# Patient Record
Sex: Female | Born: 1980 | Hispanic: No | Marital: Single | Smoking: Never smoker
Health system: Southern US, Community
[De-identification: ages and names within clinical notes are randomized; demographics above are authoritative.]

## PROBLEM LIST (undated history)

## (undated) DIAGNOSIS — G8929 Other chronic pain: Secondary | ICD-10-CM

## (undated) DIAGNOSIS — M549 Dorsalgia, unspecified: Secondary | ICD-10-CM

## (undated) DIAGNOSIS — J45909 Unspecified asthma, uncomplicated: Secondary | ICD-10-CM

## (undated) DIAGNOSIS — I890 Lymphedema, not elsewhere classified: Secondary | ICD-10-CM

---

## 2000-02-10 ENCOUNTER — Emergency Department (HOSPITAL_COMMUNITY): Admission: EM | Admit: 2000-02-10 | Discharge: 2000-02-10 | Payer: Self-pay

## 2000-11-05 ENCOUNTER — Emergency Department (HOSPITAL_COMMUNITY): Admission: EM | Admit: 2000-11-05 | Discharge: 2000-11-05 | Payer: Self-pay | Admitting: Emergency Medicine

## 2000-12-04 ENCOUNTER — Emergency Department (HOSPITAL_COMMUNITY): Admission: EM | Admit: 2000-12-04 | Discharge: 2000-12-04 | Payer: Self-pay | Admitting: Emergency Medicine

## 2005-12-31 ENCOUNTER — Emergency Department (HOSPITAL_COMMUNITY): Admission: EM | Admit: 2005-12-31 | Discharge: 2005-12-31 | Payer: Self-pay | Admitting: Emergency Medicine

## 2017-07-09 ENCOUNTER — Emergency Department (HOSPITAL_COMMUNITY): Payer: Medicaid Other

## 2017-07-09 ENCOUNTER — Inpatient Hospital Stay (HOSPITAL_COMMUNITY)
Admission: EM | Admit: 2017-07-09 | Discharge: 2017-07-10 | DRG: 872 | Discharge status: Against Medical Advice | Payer: Medicaid Other | Attending: Internal Medicine | Admitting: Internal Medicine

## 2017-07-09 DIAGNOSIS — M549 Dorsalgia, unspecified: Secondary | ICD-10-CM | POA: Diagnosis present

## 2017-07-09 DIAGNOSIS — E872 Acidosis, unspecified: Secondary | ICD-10-CM

## 2017-07-09 DIAGNOSIS — L0390 Cellulitis, unspecified: Secondary | ICD-10-CM

## 2017-07-09 DIAGNOSIS — E871 Hypo-osmolality and hyponatremia: Secondary | ICD-10-CM

## 2017-07-09 DIAGNOSIS — L03115 Cellulitis of right lower limb: Secondary | ICD-10-CM | POA: Diagnosis present

## 2017-07-09 DIAGNOSIS — A419 Sepsis, unspecified organism: Secondary | ICD-10-CM | POA: Diagnosis present

## 2017-07-09 DIAGNOSIS — G8929 Other chronic pain: Secondary | ICD-10-CM | POA: Diagnosis present

## 2017-07-09 DIAGNOSIS — L97419 Non-pressure chronic ulcer of right heel and midfoot with unspecified severity: Secondary | ICD-10-CM | POA: Diagnosis present

## 2017-07-09 DIAGNOSIS — Z791 Long term (current) use of non-steroidal anti-inflammatories (NSAID): Secondary | ICD-10-CM

## 2017-07-09 DIAGNOSIS — M793 Panniculitis, unspecified: Secondary | ICD-10-CM | POA: Diagnosis present

## 2017-07-09 DIAGNOSIS — Z5321 Procedure and treatment not carried out due to patient leaving prior to being seen by health care provider: Secondary | ICD-10-CM | POA: Diagnosis not present

## 2017-07-09 DIAGNOSIS — I890 Lymphedema, not elsewhere classified: Secondary | ICD-10-CM | POA: Diagnosis present

## 2017-07-09 HISTORY — DX: Dorsalgia, unspecified: M54.9

## 2017-07-09 HISTORY — DX: Lymphedema, not elsewhere classified: I89.0

## 2017-07-09 HISTORY — DX: Other chronic pain: G89.29

## 2017-07-09 HISTORY — DX: Morbid (severe) obesity due to excess calories: E66.01

## 2017-07-09 LAB — I-STAT CG4 LACTIC ACID, ED: LACTIC ACID, VENOUS: 2.84 mmol/L — AB (ref 0.5–1.9)

## 2017-07-09 LAB — COMPREHENSIVE METABOLIC PANEL
ALK PHOS: 51 U/L (ref 38–126)
ALT: 15 U/L (ref 14–54)
AST: 26 U/L (ref 15–41)
Albumin: 2.6 g/dL — ABNORMAL LOW (ref 3.5–5.0)
Anion gap: 7 (ref 5–15)
BUN: 9 mg/dL (ref 6–20)
CALCIUM: 8.2 mg/dL — AB (ref 8.9–10.3)
CO2: 28 mmol/L (ref 22–32)
CREATININE: 0.75 mg/dL (ref 0.44–1.00)
Chloride: 94 mmol/L — ABNORMAL LOW (ref 101–111)
Glucose, Bld: 149 mg/dL — ABNORMAL HIGH (ref 65–99)
Potassium: 2.8 mmol/L — ABNORMAL LOW (ref 3.5–5.1)
Sodium: 129 mmol/L — ABNORMAL LOW (ref 135–145)
Total Bilirubin: 0.7 mg/dL (ref 0.3–1.2)
Total Protein: 7.6 g/dL (ref 6.5–8.1)

## 2017-07-09 LAB — C-REACTIVE PROTEIN: CRP: 19.6 mg/dL — AB (ref <1.0)

## 2017-07-09 LAB — CBC WITH DIFFERENTIAL/PLATELET
BASOS PCT: 0 %
Basophils Absolute: 0 10*3/uL (ref 0.0–0.1)
EOS PCT: 1 %
Eosinophils Absolute: 0.2 10*3/uL (ref 0.0–0.7)
HCT: 29.6 % — ABNORMAL LOW (ref 36.0–46.0)
HEMOGLOBIN: 9.1 g/dL — AB (ref 12.0–15.0)
Lymphocytes Relative: 9 %
Lymphs Abs: 1.6 10*3/uL (ref 0.7–4.0)
MCH: 21.5 pg — AB (ref 26.0–34.0)
MCHC: 30.7 g/dL (ref 30.0–36.0)
MCV: 69.8 fL — AB (ref 78.0–100.0)
MONO ABS: 1.4 10*3/uL — AB (ref 0.1–1.0)
Monocytes Relative: 8 %
NEUTROS ABS: 14.3 10*3/uL — AB (ref 1.7–7.7)
NEUTROS PCT: 82 %
PLATELETS: 394 10*3/uL (ref 150–400)
RBC: 4.24 MIL/uL (ref 3.87–5.11)
RDW: 18.4 % — ABNORMAL HIGH (ref 11.5–15.5)
WBC: 17.5 10*3/uL — ABNORMAL HIGH (ref 4.0–10.5)

## 2017-07-09 LAB — BRAIN NATRIURETIC PEPTIDE: B Natriuretic Peptide: 26.4 pg/mL (ref 0.0–100.0)

## 2017-07-09 LAB — SEDIMENTATION RATE: SED RATE: 109 mm/h — AB (ref 0–22)

## 2017-07-09 MED ORDER — SODIUM CHLORIDE 0.9 % IV BOLUS (SEPSIS): 1000.0000 mL | Freq: Once | INTRAVENOUS | Status: DC

## 2017-07-09 MED ORDER — LACTATED RINGERS IV BOLUS (SEPSIS)
1000.0000 mL | Freq: Once | INTRAVENOUS | Status: AC
Start: 2017-07-09 — End: 2017-07-09
  Administered 2017-07-09: 1000 mL via INTRAVENOUS

## 2017-07-09 MED ORDER — PIPERACILLIN-TAZOBACTAM 3.375 G IVPB 30 MIN
3.3750 g | Freq: Once | INTRAVENOUS | Status: AC
  Administered 2017-07-09: 3.375 g via INTRAVENOUS
  Filled 2017-07-09: qty 50

## 2017-07-09 MED ORDER — POTASSIUM CHLORIDE 10 MEQ/100ML IV SOLN
10.0000 meq | INTRAVENOUS | Status: AC
  Administered 2017-07-09 (×2): 10 meq via INTRAVENOUS
  Filled 2017-07-09 (×2): qty 100

## 2017-07-09 MED ORDER — IOPAMIDOL (ISOVUE-300) INJECTION 61%
100.0000 mL | Freq: Once | INTRAVENOUS | Status: AC | PRN
Start: 2017-07-09 — End: 2017-07-09
  Administered 2017-07-09: 100 mL via INTRAVENOUS

## 2017-07-09 MED ORDER — SODIUM CHLORIDE 0.9 % IV BOLUS (SEPSIS)
500.0000 mL | Freq: Once | INTRAVENOUS | Status: AC
  Administered 2017-07-09: 500 mL via INTRAVENOUS

## 2017-07-09 MED ORDER — LACTATED RINGERS IV SOLN
INTRAVENOUS | Status: DC
  Administered 2017-07-10: 03:00:00 via INTRAVENOUS

## 2017-07-09 MED ORDER — VANCOMYCIN HCL IN DEXTROSE 1-5 GM/200ML-% IV SOLN
1000.0000 mg | Freq: Once | INTRAVENOUS | Status: AC
  Administered 2017-07-09: 1000 mg via INTRAVENOUS
  Filled 2017-07-09: qty 200

## 2017-07-09 MED ORDER — IOPAMIDOL (ISOVUE-300) INJECTION 61%
INTRAVENOUS | Status: AC
  Filled 2017-07-09: qty 100

## 2017-07-09 MED ORDER — POTASSIUM CHLORIDE CRYS ER 20 MEQ PO TBCR
40.0000 meq | EXTENDED_RELEASE_TABLET | Freq: Once | ORAL | Status: AC
  Administered 2017-07-09: 40 meq via ORAL
  Filled 2017-07-09: qty 2

## 2017-07-09 NOTE — ED Provider Notes (Signed)
[facility]-EMERGENCY DEPT Provider Note   CSN: [id number] Arrival date & time: 07/09/17  1733     History   Chief Complaint Chief Complaint  Patient presents with  . Infected foot (right)    HPI Ellen Schroeder is a 36 y.o. female.  HPI   36 year old female with no reported history here with worsening leg pain and swelling.  The patient is a difficult historian.  She reports that she has been dealing with leg swelling for several years.  She is on Lasix for this.  She has had increasing right leg pain and swelling over the last several days.  She is also noticed worsening abdominal pain along the inferior fold of her pannus.  She has had associated fevers and chills.  She is had poor appetite.  She denies any shortness of breath.  No pleuritic chest pain.  She denies any preceding trauma.  Pain is worse with any kind of movement or attempt to bear weight.  No alleviating factors.   Past Medical History:  Diagnosis Date  . Chronic back pain   . Lymph edema   . Morbid obesity [facility](HCC)     Patient Active Problem List   Diagnosis Date Noted  . Sepsis (HCC) 07/10/2017  . Cellulitis of right leg 07/10/2017  . 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 No pertinent surgical history.  OB History    No data available       Home Medications    Prior to Admission medications   Medication Sig Start Date End Date Taking? Authorizing Provider  furosemide (LASIX) 20 MG tablet Take 20 mg by mouth 2 (two) times daily. 06/18/17  Yes [provider]  ibuprofen (ADVIL,MOTRIN) 800 MG tablet Take 800 mg by mouth 3 (three) times daily. 06/18/17  Yes [provider]  potassium chloride 20 MEQ/15ML (10%) SOLN Take 15 mLs by mouth daily. 06/19/17  Yes [provider]  traMADol (ULTRAM) 50 MG tablet Take 1-2 tablets by mouth every 8 (eight) hours as needed for moderate pain or severe pain. 06/18/17  Yes [provider]    Family  History Family History  Problem Relation Age of Onset  . Diabetes Mother   . Hypertension Mother   . Diabetes Father   . Hypertension Father   . Hyperlipidemia Father     Social History Social History   Tobacco Use  . Smoking status: Never Smoker  Substance Use Topics  . Alcohol use: No    Frequency: Never  . Drug use: No     Allergies   Patient has no known allergies.   Review of Systems Review of Systems  Constitutional: Positive for fatigue.  Cardiovascular: Positive for leg swelling.  Skin: Positive for rash and wound.  Neurological: Positive for weakness.     Physical Exam Updated Vital Signs BP 110/61   Pulse 99   Temp 99.2 F (37.3 C) (Oral)   Resp (!) 29   LMP 06/05/2017   SpO2 96%   Physical Exam  Constitutional: She is oriented to person, place, and time. She appears well-developed and well-nourished. No distress.  HENT:  Head: Normocephalic and atraumatic.  Eyes: Conjunctivae are normal.  Neck: Neck supple.  Cardiovascular: Normal rate, regular rhythm and normal heart sounds. Exam reveals no friction rub.  No murmur heard. Pulmonary/Chest: Effort normal and breath sounds normal. No respiratory distress. She has no wheezes. She has no rales.  Abdominal: She exhibits no distension.  Significant erythema of inferior  pannicular fold with associated superficial skin ulcerations and excoriations  Genitourinary:  Genitourinary Comments: Erythema, induration to mons and inguinal folds and pannicular folds. There does not appear to be overt erythema, drainage or extension to the labia majora or minora. There is no perineal erythema or discharge. No crepitance.  Musculoskeletal: She exhibits no edema.  [place] bilateral lower extremity edema with significant erythema of right lower leg, with open, macerated skin ulcer along plantar aspect of foot  Neurological: She is alert and oriented to person, place, and time. She exhibits normal muscle tone.  Skin:  Skin is warm. Capillary refill takes less than 2 seconds.  Psychiatric: She has a normal mood and affect.  Nursing note and vitals reviewed.             ED Treatments / Results  Labs (all labs ordered are listed, but only abnormal results are displayed) Labs Reviewed  CBC WITH DIFFERENTIAL/PLATELET - Abnormal; Notable for the following components:      Result Value   WBC 17.5 (*)    Hemoglobin 9.1 (*)    HCT 29.6 (*)    MCV 69.8 (*)    MCH 21.5 (*)    RDW 18.4 (*)    Neutro Abs 14.3 (*)    Monocytes Absolute 1.4 (*)    All other components within normal limits  COMPREHENSIVE METABOLIC PANEL - Abnormal; Notable for the following components:   Sodium 129 (*)    Potassium 2.8 (*)    Chloride 94 (*)    Glucose, Bld 149 (*)    Calcium 8.2 (*)    Albumin 2.6 (*)    All other components within normal limits  SEDIMENTATION RATE - Abnormal; Notable for the following components:   Sed Rate 109 (*)    All other components within normal limits  C-REACTIVE PROTEIN - Abnormal; Notable for the following components:   CRP 19.6 (*)    All other components within normal limits  I-STAT CG4 LACTIC ACID, ED - Abnormal; Notable for the following components:   Lactic Acid, Venous 2.84 (*)    All other components within normal limits  CULTURE, BLOOD (ROUTINE X 2)  CULTURE, BLOOD (ROUTINE X 2)  BRAIN NATRIURETIC PEPTIDE  HEMOGLOBIN A1C  HIV ANTIBODY (ROUTINE TESTING)  PREALBUMIN  CBC  BASIC METABOLIC PANEL  BASIC METABOLIC PANEL  LACTIC ACID, PLASMA  I-STAT CG4 LACTIC ACID, ED    EKG  EKG Interpretation None       Radiology Dg Tibia/fibula Right  Result Date: 07/09/2017 CLINICAL DATA:  Leg wounds EXAM: RIGHT TIBIA AND FIBULA - 2 VIEW COMPARISON:  None. FINDINGS: No fracture or malalignment. No periostitis or bone destruction. Diffuse soft tissue swelling. No soft tissue gas IMPRESSION: No acute osseous abnormality Electronically Signed   By: Jasmine Pang M.D.   On:  07/09/2017 20:57   Ct Abdomen Pelvis W Contrast  Result Date: 07/09/2017 CLINICAL DATA:  36 year old female with nausea vomiting. EXAM: CT ABDOMEN AND PELVIS WITH CONTRAST TECHNIQUE: Multidetector CT imaging of the abdomen and pelvis was performed using the standard protocol following bolus administration of intravenous contrast. CONTRAST:  ISOVUE-300 IOPAMIDOL (ISOVUE-300) INJECTION 61% COMPARISON:  None. FINDINGS: Lower chest: Minimal bibasilar atelectatic changes. There is herniation of small amount of mesenteric fat through the diaphragmatic hiatus. No intra-abdominal free air or free fluid. Hepatobiliary: There is diffuse fatty infiltration of the liver. No intrahepatic biliary ductal dilatation. The gallbladder is mildly distended. Tiny stones or sludge may be present in the  gallbladder fundus. No pericholecystic fluid or evidence of acute cholecystitis by CT. Pancreas: Unremarkable. No pancreatic ductal dilatation or surrounding inflammatory changes. Spleen: Normal in size without focal abnormality. Adrenals/Urinary Tract: The adrenal glands are unremarkable. There is no hydronephrosis on either side. The visualized ureters and urinary bladder appear unremarkable. Stomach/Bowel: There is no bowel obstruction or active inflammation. Normal appendix. Vascular/Lymphatic: No significant vascular findings are present. No enlarged abdominal or pelvic lymph nodes. Reproductive: The uterus is anteverted and grossly unremarkable. The ovaries appear unremarkable as well. No pelvic mass. Other: There is thickening of the skin and stranding of the subcutaneous soft tissues of the anterior pelvic wall. No fluid collection. Musculoskeletal: No acute or significant osseous findings. IMPRESSION: 1. No bowel obstruction or active inflammation.  Normal appendix. 2. Fatty liver. Electronically Signed   By: Elgie Collard M.D.   On: 07/09/2017 22:08   Dg Foot Complete Right  Result Date: 07/09/2017 CLINICAL  DATA:  Right leg wounds EXAM: RIGHT FOOT COMPLETE - 3+ VIEW COMPARISON:  12/31/2005 FINDINGS: No fracture or malalignment. No soft tissue gas. No periostitis or bone destruction. IMPRESSION: No acute osseous abnormality. Electronically Signed   By: Jasmine Pang M.D.   On: 07/09/2017 20:56   Dg Femur Min 2 Views Right  Result Date: 07/09/2017 CLINICAL DATA:  Leg pain EXAM: RIGHT FEMUR 2 VIEWS COMPARISON:  None. FINDINGS: The study is limited by habitus. No fracture or malalignment. Mild degenerative changes at the knee. Soft tissue swelling. No soft tissue gas. No periostitis or bone destruction IMPRESSION: No definite acute osseous abnormality. Electronically Signed   By: Jasmine Pang M.D.   On: 07/09/2017 20:58    Procedures .Critical Care Performed by: Shaune Pollack, MD Authorized by: Shaune Pollack, MD   Critical care provider statement:    Critical care time (minutes):  35   Critical care time was exclusive of:  Separately billable procedures and treating other patients and teaching time   Critical care was necessary to treat or prevent imminent or life-threatening deterioration of the following conditions:  Circulatory failure, dehydration and sepsis   Critical care was time spent personally by me on the following activities:  Development of treatment plan with patient or surrogate, discussions with consultants, evaluation of patient's response to treatment, examination of patient, obtaining history from patient or surrogate, ordering and performing treatments and interventions, ordering and review of laboratory studies, ordering and review of radiographic studies, pulse oximetry, re-evaluation of patient's condition and review of old charts   I assumed direction of critical care for this patient from another provider in my specialty: no     (including critical care time)  Medications Ordered in ED Medications  potassium chloride 10 mEq in 100 mL IVPB (10 mEq Intravenous Not Given  07/10/17 0224)  iopamidol (ISOVUE-300) 61 % injection (not administered)  lactated ringers infusion ( Intravenous New Bag/Given 07/10/17 0231)  acetaminophen (TYLENOL) tablet 650 mg (not administered)    Or  acetaminophen (TYLENOL) suppository 650 mg (not administered)  ondansetron (ZOFRAN) tablet 4 mg (not administered)    Or  ondansetron (ZOFRAN) injection 4 mg (not administered)  enoxaparin (LOVENOX) injection 40 mg (not administered)  piperacillin-tazobactam (ZOSYN) IVPB 3.375 g (not administered)  vancomycin (VANCOCIN) IVPB 1000 mg/200 mL premix (0 mg Intravenous Stopped 07/09/17 2216)  piperacillin-tazobactam (ZOSYN) IVPB 3.375 g (0 g Intravenous Stopped 07/09/17 2039)  potassium chloride SA (K-DUR,KLOR-CON) CR tablet 40 mEq (40 mEq Oral Given 07/09/17 2149)  sodium chloride 0.9 % bolus 500 mL (  0 mLs Intravenous Stopped 07/09/17 2216)  iopamidol (ISOVUE-300) 61 % injection 100 mL (100 mLs Intravenous Contrast Given 07/09/17 2122)  sodium chloride 0.9 % bolus 500 mL (0 mLs Intravenous Stopped 07/09/17 2304)  lactated ringers bolus 1,000 mL (0 mLs Intravenous Stopped 07/09/17 2343)  lactated ringers bolus 1,000 mL (0 mLs Intravenous Stopped 07/09/17 2343)  sodium chloride 0.9 % bolus 1,000 mL (0 mLs Intravenous Stopped 07/10/17 0130)  clindamycin (CLEOCIN) IVPB 900 mg (900 mg Intravenous New Bag/Given 07/10/17 0231)     Initial Impression / Assessment and Plan / ED Course  I have reviewed the triage vital signs and the nursing notes.  Pertinent labs & imaging results that were available during my care of the patient were reviewed by me and considered in my medical decision making (see chart for details).     36 year old female with history of morbid obesity, lymphedema, who presents with erythema and pain of her right leg.  Patient initially hemodynamically stable.  She has a significant leukocytosis as well as lactic acidosis.  She was given IV fluids and antibiotics.  Code sepsis  initiated.  This was initially delayed as patient was not tachycardic or hypotensive.  She has been given fluid resuscitation but given her morbid obesity, will hold on a full 30 cc/kg as she has also previously been on Lasix due to her chronic lymphedema and leg swelling.  Regarding her source of infection, I suspect she has panniculitis as well as a focal cellulitis and possible ulceration of the right heel.  She has no pain beyond the areas of erythema, no crepitance, and I obtained a CT scan which shows no evidence of necrotizing infection, though will need to be monitored closely. D/w Dr. Julian ReilGardner of Hospitalist who is in agreement. Repeat exam shows no expansion of erythema or tenderness. There does not appear to be extension onto the labia or perineum. Admit to SDU.  Final Clinical Impressions(s) / ED Diagnoses   Final diagnoses:  Sepsis due to cellulitis (HCC)  Lactic acidosis  Hyponatremia    ED Discharge Orders    None       Shaune PollackIsaacs, Spence Soberano, MD 07/10/17 [phone]

## 2017-07-09 NOTE — ED Triage Notes (Signed)
Per EMS, pt is coming from home with complaints of pain in her right foot with pitting edema x3 days. EMS reports an open heel on patients right heel with foul odor. Pt has a hx of asthma.

## 2017-07-09 NOTE — ED Notes (Addendum)
Pt stated that she had the wound on her R leg appear a month ago and had the abdominal wound appear about a week ago. Her abdomen is hot to touch, red and edematous.  Her right leg is dusky and hot to touch, cap refill is slowed. Her left leg cap refill is normal. Her abdomen wound is weeping and so is her right leg.

## 2017-07-09 NOTE — Progress Notes (Signed)
A consult was received from an ED physician for Vancomycin and Zosyn per pharmacy dosing.  The patient's profile has been reviewed for ht/wt/allergies/indication/available labs. A one time order has been placed for the above antibiotics.  Further antibiotics/pharmacy consults should be ordered by admitting physician if indicated.                       Thank you, Bernadene Personrew Nickolas Chalfin, PharmD, BCPS Pager: [phone] 07/09/2017, 7:20 PM

## 2017-07-10 ENCOUNTER — Inpatient Hospital Stay (HOSPITAL_COMMUNITY): Admit: 2017-07-10 | Payer: Medicaid Other

## 2017-07-10 ENCOUNTER — Encounter (HOSPITAL_COMMUNITY): Payer: Self-pay | Admitting: Internal Medicine

## 2017-07-10 DIAGNOSIS — I890 Lymphedema, not elsewhere classified: Secondary | ICD-10-CM | POA: Diagnosis present

## 2017-07-10 DIAGNOSIS — Z791 Long term (current) use of non-steroidal anti-inflammatories (NSAID): Secondary | ICD-10-CM | POA: Diagnosis not present

## 2017-07-10 DIAGNOSIS — M793 Panniculitis, unspecified: Secondary | ICD-10-CM | POA: Diagnosis present

## 2017-07-10 DIAGNOSIS — L03115 Cellulitis of right lower limb: Secondary | ICD-10-CM | POA: Diagnosis present

## 2017-07-10 DIAGNOSIS — Z5321 Procedure and treatment not carried out due to patient leaving prior to being seen by health care provider: Secondary | ICD-10-CM | POA: Diagnosis not present

## 2017-07-10 DIAGNOSIS — G8929 Other chronic pain: Secondary | ICD-10-CM | POA: Diagnosis present

## 2017-07-10 DIAGNOSIS — M549 Dorsalgia, unspecified: Secondary | ICD-10-CM | POA: Diagnosis present

## 2017-07-10 DIAGNOSIS — A419 Sepsis, unspecified organism: Secondary | ICD-10-CM | POA: Diagnosis not present

## 2017-07-10 DIAGNOSIS — L97419 Non-pressure chronic ulcer of right heel and midfoot with unspecified severity: Secondary | ICD-10-CM | POA: Diagnosis present

## 2017-07-10 DIAGNOSIS — E872 Acidosis: Secondary | ICD-10-CM | POA: Diagnosis present

## 2017-07-10 DIAGNOSIS — E871 Hypo-osmolality and hyponatremia: Secondary | ICD-10-CM | POA: Diagnosis present

## 2017-07-10 LAB — BASIC METABOLIC PANEL
ANION GAP: 6 (ref 5–15)
Anion gap: 8 (ref 5–15)
BUN: 6 mg/dL (ref 6–20)
BUN: <5 mg/dL — ABNORMAL LOW (ref 6–20)
CALCIUM: 7.7 mg/dL — AB (ref 8.9–10.3)
CHLORIDE: 101 mmol/L (ref 101–111)
CO2: 27 mmol/L (ref 22–32)
CO2: 28 mmol/L (ref 22–32)
CREATININE: 0.65 mg/dL (ref 0.44–1.00)
Calcium: 7.9 mg/dL — ABNORMAL LOW (ref 8.9–10.3)
Chloride: 104 mmol/L (ref 101–111)
Creatinine, Ser: 0.62 mg/dL (ref 0.44–1.00)
GFR calc Af Amer: >60 mL/min (ref >60)
GFR calc non Af Amer: >60 mL/min (ref >60)
GFR calc non Af Amer: >60 mL/min (ref >60)
GLUCOSE: 117 mg/dL — AB (ref 65–99)
Glucose, Bld: 113 mg/dL — ABNORMAL HIGH (ref 65–99)
Potassium: 2.8 mmol/L — ABNORMAL LOW (ref 3.5–5.1)
Potassium: 3.5 mmol/L (ref 3.5–5.1)
Sodium: 137 mmol/L (ref 135–145)
Sodium: 137 mmol/L (ref 135–145)

## 2017-07-10 LAB — HIV ANTIBODY (ROUTINE TESTING W REFLEX): HIV SCREEN 4TH GENERATION: NONREACTIVE

## 2017-07-10 LAB — CBC
HEMATOCRIT: 27.6 % — AB (ref 36.0–46.0)
Hemoglobin: 8.5 g/dL — ABNORMAL LOW (ref 12.0–15.0)
MCH: 21.5 pg — ABNORMAL LOW (ref 26.0–34.0)
MCHC: 30.8 g/dL (ref 30.0–36.0)
MCV: 69.9 fL — AB (ref 78.0–100.0)
Platelets: 326 10*3/uL (ref 150–400)
RBC: 3.95 MIL/uL (ref 3.87–5.11)
RDW: 18.5 % — AB (ref 11.5–15.5)
WBC: 12.5 10*3/uL — AB (ref 4.0–10.5)

## 2017-07-10 LAB — I-STAT CG4 LACTIC ACID, ED: Lactic Acid, Venous: 1.3 mmol/L (ref 0.5–1.9)

## 2017-07-10 LAB — HEMOGLOBIN A1C
Hgb A1c MFr Bld: 6.2 % — ABNORMAL HIGH (ref 4.8–5.6)
MEAN PLASMA GLUCOSE: 131.24 mg/dL

## 2017-07-10 LAB — PREALBUMIN

## 2017-07-10 MED ORDER — ONDANSETRON HCL 4 MG PO TABS: 4.0000 mg | ORAL_TABLET | Freq: Four times a day (QID) | ORAL | Status: DC | PRN

## 2017-07-10 MED ORDER — POTASSIUM CHLORIDE CRYS ER 20 MEQ PO TBCR: 40.0000 meq | EXTENDED_RELEASE_TABLET | Freq: Once | ORAL | Status: DC

## 2017-07-10 MED ORDER — ONDANSETRON HCL 4 MG/2ML IJ SOLN: 4.0000 mg | Freq: Four times a day (QID) | INTRAMUSCULAR | Status: DC | PRN

## 2017-07-10 MED ORDER — ACETAMINOPHEN 650 MG RE SUPP: 650.0000 mg | Freq: Four times a day (QID) | RECTAL | Status: DC | PRN

## 2017-07-10 MED ORDER — ENOXAPARIN SODIUM 40 MG/0.4ML ~~LOC~~ SOLN: 40.0000 mg | SUBCUTANEOUS | Status: DC

## 2017-07-10 MED ORDER — CLINDAMYCIN PHOSPHATE 900 MG/50ML IV SOLN
900.0000 mg | Freq: Once | INTRAVENOUS | Status: AC
  Administered 2017-07-10: 900 mg via INTRAVENOUS
  Filled 2017-07-10: qty 50

## 2017-07-10 MED ORDER — PIPERACILLIN-TAZOBACTAM 3.375 G IVPB
3.3750 g | Freq: Three times a day (TID) | INTRAVENOUS | Status: DC
  Administered 2017-07-10: 3.375 g via INTRAVENOUS
  Filled 2017-07-10: qty 50

## 2017-07-10 MED ORDER — SODIUM CHLORIDE 0.9 % IV BOLUS (SEPSIS)
1000.0000 mL | Freq: Once | INTRAVENOUS | Status: AC
  Administered 2017-07-10: 1000 mL via INTRAVENOUS

## 2017-07-10 MED ORDER — VANCOMYCIN HCL 10 G IV SOLR
1250.0000 mg | Freq: Three times a day (TID) | INTRAVENOUS | Status: DC
  Administered 2017-07-10: 1250 mg via INTRAVENOUS
  Filled 2017-07-10: qty 1250
  Filled 2017-07-10: qty 500

## 2017-07-10 MED ORDER — ACETAMINOPHEN 325 MG PO TABS: 650.0000 mg | ORAL_TABLET | Freq: Four times a day (QID) | ORAL | Status: DC | PRN

## 2017-07-10 NOTE — ED Notes (Signed)
I gave critical I Stat CG4 results to MD Isaacs 

## 2017-07-10 NOTE — H&P (Addendum)
History and Physical    Ellen Schroeder ZOX:[id number] DOB: 1981/06/07 DOA: 07/09/2017  PCP: Anne Hahn, PA-C  Patient coming from: Home  I have personally briefly reviewed patient's old medical records in [facility]  Chief Complaint: Infected foot, R  HPI: Ellen Schroeder is a 36 y.o. female with medical history significant of obesity, chronic lymphedema.  Patient presents to the ED with c/o worsening R leg pain, swelling.  Patient has 2 sites of cellulitic erythema.  One is the R foot which has been worsening over the last several days.  Also erythema, swelling, and drainage from lower abdomen for past 2 days.  Associated fever and chills.  No pain with abdominal drainage.  R foot cellulitis pain is primarily located in heel.  No pain beyond site of redness at either site, no pain out of proportion to infection.   ED Course: Septic with WBC 17k, Lactate 2.8, BPs running on soft side.  Got sepsis IVF bolus.  Zosyn and vanc.   Review of Systems: As per HPI otherwise 10 point review of systems negative.   Past Medical History:  Diagnosis Date  . Chronic back pain   . Lymph edema   . Morbid obesity (HCC)     History reviewed. No pertinent surgical history.   reports that  has never smoked. She does not have any smokeless tobacco history on file. She reports that she does not drink alcohol or use drugs.  No Known Allergies  Family History  Problem Relation Age of Onset  . Diabetes Mother   . Hypertension Mother   . Diabetes Father   . Hypertension Father   . Hyperlipidemia Father      Prior to Admission medications   Medication Sig Start Date End Date Taking? Authorizing Provider  furosemide (LASIX) 20 MG tablet Take 20 mg by mouth 2 (two) times daily. 06/18/17  Yes [provider]  ibuprofen (ADVIL,MOTRIN) 800 MG tablet Take 800 mg by mouth 3 (three) times daily. 06/18/17  Yes [provider]  potassium chloride 20 MEQ/15ML (10%) SOLN Take 15 mLs by  mouth daily. 06/19/17  Yes [provider]  traMADol (ULTRAM) 50 MG tablet Take 1-2 tablets by mouth every 8 (eight) hours as needed for moderate pain or severe pain. 06/18/17  Yes [provider]    Physical Exam: Vitals:   07/09/17 1800 07/09/17 1910 07/09/17 2100 07/09/17 2300  BP:  101/62 (!) 96/54 (!) 101/55  Pulse:  98 99 (!) 101  Resp:  18 18 (!) 22  Temp:  99.2 F (37.3 C)    TempSrc:  Oral    SpO2: 100% 100% 99% 99%    Constitutional: NAD, calm, comfortable Eyes: PERRL, lids and conjunctivae normal ENMT: Mucous membranes are moist. Posterior pharynx clear of any exudate or lesions.Normal dentition.  Neck: normal, supple, no masses, no thyromegaly Respiratory: clear to auscultation bilaterally, no wheezing, no crackles. Normal respiratory effort. No accessory muscle use.  Cardiovascular: Regular rate and rhythm, no murmurs / rubs / gallops. No extremity edema. 2+ pedal pulses. No carotid bruits.  Abdomen: no tenderness, no masses palpated. No hepatosplenomegaly. Bowel sounds positive.  Musculoskeletal: no clubbing / cyanosis. No joint deformity upper and lower extremities. Good ROM, no contractures. Normal muscle tone.  Skin: 2 separate sites of cellulitis.  One on RLE coming from ulcer on posterior aspect of R heel.  Also has paniliculitis with foul smelling discharge.  Neither site has pain beyond site of redness, severe pain  out of proportion to exam findings, nor sub Q air. Neurologic: CN 2-12 grossly intact. Sensation intact, DTR normal. Strength 5/5 in all 4.  Psychiatric: Normal judgment and insight. Alert and oriented x 3. Normal mood.    Labs on Admission: I have personally reviewed following labs and imaging studies  CBC: Recent Labs  Lab 07/09/17 1851  WBC 17.5*  NEUTROABS 14.3*  HGB 9.1*  HCT 29.6*  MCV 69.8*  PLT 394   Basic Metabolic Panel: Recent Labs  Lab 07/09/17 1851  NA 129*  K 2.8*  CL 94*  CO2 28  GLUCOSE 149*  BUN 9    CREATININE 0.75  CALCIUM 8.2*   GFR: CrCl cannot be calculated (Unknown ideal weight.). Liver Function Tests: Recent Labs  Lab 07/09/17 1851  AST 26  ALT 15  ALKPHOS 51  BILITOT 0.7  PROT 7.6  ALBUMIN 2.6*   No results for input(s): LIPASE, AMYLASE in the last 168 hours. No results for input(s): AMMONIA in the last 168 hours. Coagulation Profile: No results for input(s): INR, PROTIME in the last 168 hours. Cardiac Enzymes: No results for input(s): CKTOTAL, CKMB, CKMBINDEX, TROPONINI in the last 168 hours. BNP (last 3 results) No results for input(s): PROBNP in the last 8760 hours. HbA1C: No results for input(s): HGBA1C in the last 72 hours. CBG: No results for input(s): GLUCAP in the last 168 hours. Lipid Profile: No results for input(s): CHOL, HDL, LDLCALC, TRIG, CHOLHDL, LDLDIRECT in the last 72 hours. Thyroid Function Tests: No results for input(s): TSH, T4TOTAL, FREET4, T3FREE, THYROIDAB in the last 72 hours. Anemia Panel: No results for input(s): VITAMINB12, FOLATE, FERRITIN, TIBC, IRON, RETICCTPCT in the last 72 hours. Urine analysis: No results found for: COLORURINE, APPEARANCEUR, LABSPEC, PHURINE, GLUCOSEU, HGBUR, BILIRUBINUR, KETONESUR, PROTEINUR, UROBILINOGEN, NITRITE, LEUKOCYTESUR  Radiological Exams on Admission: Dg Tibia/fibula Right  Result Date: 07/09/2017 CLINICAL DATA:  Leg wounds EXAM: RIGHT TIBIA AND FIBULA - 2 VIEW COMPARISON:  None. FINDINGS: No fracture or malalignment. No periostitis or bone destruction. Diffuse soft tissue swelling. No soft tissue gas IMPRESSION: No acute osseous abnormality Electronically Signed   By: Jasmine PangKim  Fujinaga M.D.   On: 07/09/2017 20:57   Ct Abdomen Pelvis W Contrast  Result Date: 07/09/2017 CLINICAL DATA:  36 year old female with nausea vomiting. EXAM: CT ABDOMEN AND PELVIS WITH CONTRAST TECHNIQUE: Multidetector CT imaging of the abdomen and pelvis was performed using the standard protocol following bolus administration  of intravenous contrast. CONTRAST:  100mL ISOVUE-300 IOPAMIDOL (ISOVUE-300) INJECTION 61% COMPARISON:  None. FINDINGS: Lower chest: Minimal bibasilar atelectatic changes. There is herniation of small amount of mesenteric fat through the diaphragmatic hiatus. No intra-abdominal free air or free fluid. Hepatobiliary: There is diffuse fatty infiltration of the liver. No intrahepatic biliary ductal dilatation. The gallbladder is mildly distended. Tiny stones or sludge may be present in the gallbladder fundus. No pericholecystic fluid or evidence of acute cholecystitis by CT. Pancreas: Unremarkable. No pancreatic ductal dilatation or surrounding inflammatory changes. Spleen: Normal in size without focal abnormality. Adrenals/Urinary Tract: The adrenal glands are unremarkable. There is no hydronephrosis on either side. The visualized ureters and urinary bladder appear unremarkable. Stomach/Bowel: There is no bowel obstruction or active inflammation. Normal appendix. Vascular/Lymphatic: No significant vascular findings are present. No enlarged abdominal or pelvic lymph nodes. Reproductive: The uterus is anteverted and grossly unremarkable. The ovaries appear unremarkable as well. No pelvic mass. Other: There is thickening of the skin and stranding of the subcutaneous soft tissues of the anterior pelvic wall. No  fluid collection. Musculoskeletal: No acute or significant osseous findings. IMPRESSION: 1. No bowel obstruction or active inflammation.  Normal appendix. 2. Fatty liver. Electronically Signed   By: Elgie CollardArash  Radparvar M.D.   On: 07/09/2017 22:08   Dg Foot Complete Right  Result Date: 07/09/2017 CLINICAL DATA:  Right leg wounds EXAM: RIGHT FOOT COMPLETE - 3+ VIEW COMPARISON:  12/31/2005 FINDINGS: No fracture or malalignment. No soft tissue gas. No periostitis or bone destruction. IMPRESSION: No acute osseous abnormality. Electronically Signed   By: Jasmine PangKim  Fujinaga M.D.   On: 07/09/2017 20:56   Dg Femur Min 2 Views  Right  Result Date: 07/09/2017 CLINICAL DATA:  Leg pain EXAM: RIGHT FEMUR 2 VIEWS COMPARISON:  None. FINDINGS: The study is limited by habitus. No fracture or malalignment. Mild degenerative changes at the knee. Soft tissue swelling. No soft tissue gas. No periostitis or bone destruction IMPRESSION: No definite acute osseous abnormality. Electronically Signed   By: Jasmine PangKim  Fujinaga M.D.   On: 07/09/2017 20:58    EKG: Independently reviewed.  Assessment/Plan Principal Problem:   Sepsis (HCC) Active Problems:   Cellulitis of right leg   Panniculitis    1. Sepsis from 2 sites of cellulitis - 1. Zosyn / vanc, got 1 dose of clinda in ED 2. Repeat lactic acid and BMP pending 3. Sepsis is worrisome, but neither site has clinical exam findings or X ray findings suggestive of Nec Fash. No pain beyond site of redness, no pain out of proportion to exam findings (actually pain is more mild than I would expect from exam findings), no Sub Q air on exam or imaging. 4. IVF: 4L bolus thus far in ED 5. LE wound pathway 6. Repeat BMP, CBC in AM  DVT prophylaxis: Lovenox Code Status: Full Family Communication: No family in room Disposition Plan: Home after admit Consults called: No formal consults called, did curbside Dr. Maisie Fushomas who recommended outlining extent of erythema, but again findings not really high suspicion for nec fash at this point. Admission status: Admit to inpatient - inpatient status for treatment of sepsis.   Hillary BowGARDNER, Alem Fahl M. DO [facility] Pager [phone]  If 7AM-7PM, please contact day team taking care of patient [email or web address] Password [email or web address]  07/10/2017, 12:49 AM

## 2017-07-10 NOTE — ED Notes (Addendum)
Patient stating "I miss my kids and I feel a lot better now. I want to go home." This RN explained the importance of staying to the patient. Patient still would like to go home and leave AMA. Will page hospitalist to notify them of patient's decision.

## 2017-07-10 NOTE — ED Notes (Addendum)
Spoke with hopsitalist Dr. Renford DillsAdhikari. He will come visit patient momentarily to discuss her options for staying or leaving the hospital.

## 2017-07-10 NOTE — Progress Notes (Signed)
Pharmacy Antibiotic Note  Ellen Schroeder is a 36 y.o. female with worsening leg pain and swelling admitted on 07/09/2017 with wound infection.  Pharmacy has been consulted for zosyn and vancomycin dosing.  Plan: Zosyn 3.375 Gm IV q8h EI Vancomycin 1 Gm x1 then 1250 mg IV q8h for est AUC=457 Goal AUC=400-500 F/u scr/cultures/levels     Temp (24hrs), Avg:99.2 F (37.3 C), Min:99.2 F (37.3 C), Max:99.2 F (37.3 C)  Recent Labs  Lab 07/09/17 1851 07/09/17 2357  WBC 17.5*  --   CREATININE 0.75  --   LATICACIDVEN  --  2.84*    CrCl cannot be calculated (Unknown ideal weight.).    No Known Allergies  Antimicrobials this admission: 12/24 vancomycin >>  12/24 zosyn >>   Dose adjustments this admission:   Microbiology results:  BCx:   UCx:    Sputum:    MRSA PCR:   Thank you for allowing pharmacy to be a part of this patient's care.  Lorenza EvangelistGreen, Jessilyn Catino R 07/10/2017 12:18 AM

## 2017-07-10 NOTE — ED Notes (Signed)
Patient transported to lobby after leaving AMA. Will get ride from mother after using lobby phone.

## 2017-07-10 NOTE — ED Notes (Signed)
PT EKG done in error 12/25 @ 0619, 0620, 0621. Wrong pt!

## 2017-07-11 ENCOUNTER — Inpatient Hospital Stay (HOSPITAL_COMMUNITY): Payer: Medicaid Other

## 2017-07-11 ENCOUNTER — Inpatient Hospital Stay (HOSPITAL_COMMUNITY)
Admission: EM | Admit: 2017-07-11 | Discharge: 2017-07-14 | DRG: 872 | Disposition: A | Discharge status: Home / Self Care | Payer: Medicaid Other | Attending: Internal Medicine | Admitting: Internal Medicine

## 2017-07-11 ENCOUNTER — Encounter (HOSPITAL_COMMUNITY): Payer: Self-pay

## 2017-07-11 DIAGNOSIS — L304 Erythema intertrigo: Secondary | ICD-10-CM | POA: Diagnosis present

## 2017-07-11 DIAGNOSIS — L03115 Cellulitis of right lower limb: Secondary | ICD-10-CM | POA: Diagnosis present

## 2017-07-11 DIAGNOSIS — L97419 Non-pressure chronic ulcer of right heel and midfoot with unspecified severity: Secondary | ICD-10-CM | POA: Diagnosis present

## 2017-07-11 DIAGNOSIS — J45909 Unspecified asthma, uncomplicated: Secondary | ICD-10-CM | POA: Diagnosis present

## 2017-07-11 DIAGNOSIS — A419 Sepsis, unspecified organism: Secondary | ICD-10-CM | POA: Diagnosis not present

## 2017-07-11 DIAGNOSIS — M793 Panniculitis, unspecified: Secondary | ICD-10-CM

## 2017-07-11 DIAGNOSIS — E876 Hypokalemia: Secondary | ICD-10-CM | POA: Diagnosis present

## 2017-07-11 DIAGNOSIS — L03116 Cellulitis of left lower limb: Secondary | ICD-10-CM | POA: Diagnosis present

## 2017-07-11 DIAGNOSIS — G8929 Other chronic pain: Secondary | ICD-10-CM | POA: Diagnosis present

## 2017-07-11 DIAGNOSIS — Z6841 Body Mass Index (BMI) 40.0 and over, adult: Secondary | ICD-10-CM | POA: Diagnosis not present

## 2017-07-11 DIAGNOSIS — J45901 Unspecified asthma with (acute) exacerbation: Secondary | ICD-10-CM

## 2017-07-11 DIAGNOSIS — D649 Anemia, unspecified: Secondary | ICD-10-CM | POA: Diagnosis not present

## 2017-07-11 DIAGNOSIS — I890 Lymphedema, not elsewhere classified: Secondary | ICD-10-CM | POA: Diagnosis present

## 2017-07-11 DIAGNOSIS — M79609 Pain in unspecified limb: Secondary | ICD-10-CM

## 2017-07-11 DIAGNOSIS — Z791 Long term (current) use of non-steroidal anti-inflammatories (NSAID): Secondary | ICD-10-CM

## 2017-07-11 DIAGNOSIS — D509 Iron deficiency anemia, unspecified: Secondary | ICD-10-CM | POA: Diagnosis present

## 2017-07-11 DIAGNOSIS — M549 Dorsalgia, unspecified: Secondary | ICD-10-CM | POA: Diagnosis present

## 2017-07-11 HISTORY — DX: Unspecified asthma, uncomplicated: J45.909

## 2017-07-11 LAB — IRON AND TIBC
IRON: 11 ug/dL — AB (ref 28–170)
Saturation Ratios: 5 % — ABNORMAL LOW (ref 10.4–31.8)
TIBC: 210 ug/dL — AB (ref 250–450)
UIBC: 199 ug/dL

## 2017-07-11 LAB — BASIC METABOLIC PANEL
ANION GAP: 8 (ref 5–15)
BUN: <5 mg/dL — ABNORMAL LOW (ref 6–20)
CHLORIDE: 102 mmol/L (ref 101–111)
CO2: 25 mmol/L (ref 22–32)
CREATININE: 0.63 mg/dL (ref 0.44–1.00)
Calcium: 7.9 mg/dL — ABNORMAL LOW (ref 8.9–10.3)
GFR calc non Af Amer: >60 mL/min (ref >60)
Glucose, Bld: 124 mg/dL — ABNORMAL HIGH (ref 65–99)
Potassium: 2.9 mmol/L — ABNORMAL LOW (ref 3.5–5.1)
Sodium: 135 mmol/L (ref 135–145)

## 2017-07-11 LAB — CBC WITH DIFFERENTIAL/PLATELET
BASOS ABS: 0 10*3/uL (ref 0.0–0.1)
BASOS PCT: 0 %
EOS ABS: 0.3 10*3/uL (ref 0.0–0.7)
Eosinophils Relative: 2 %
HCT: 25.8 % — ABNORMAL LOW (ref 36.0–46.0)
HEMOGLOBIN: 7.8 g/dL — AB (ref 12.0–15.0)
LYMPHS ABS: 2 10*3/uL (ref 0.7–4.0)
Lymphocytes Relative: 15 %
MCH: 21.4 pg — ABNORMAL LOW (ref 26.0–34.0)
MCHC: 30.2 g/dL (ref 30.0–36.0)
MCV: 70.7 fL — ABNORMAL LOW (ref 78.0–100.0)
MONO ABS: 1.1 10*3/uL — AB (ref 0.1–1.0)
Monocytes Relative: 8 %
NEUTROS ABS: 10.1 10*3/uL — AB (ref 1.7–7.7)
Neutrophils Relative %: 75 %
PLATELETS: 361 10*3/uL (ref 150–400)
RBC: 3.65 MIL/uL — ABNORMAL LOW (ref 3.87–5.11)
RDW: 18.7 % — AB (ref 11.5–15.5)
WBC: 13.5 10*3/uL — ABNORMAL HIGH (ref 4.0–10.5)

## 2017-07-11 LAB — POTASSIUM: Potassium: 3.1 mmol/L — ABNORMAL LOW (ref 3.5–5.1)

## 2017-07-11 LAB — RETICULOCYTES
RBC.: 3.75 MIL/uL — ABNORMAL LOW (ref 3.87–5.11)
Retic Count, Absolute: 48.8 10*3/uL (ref 19.0–186.0)
Retic Ct Pct: 1.3 % (ref 0.4–3.1)

## 2017-07-11 LAB — FERRITIN: FERRITIN: 44 ng/mL (ref 11–307)

## 2017-07-11 LAB — FOLATE: Folate: 6.1 ng/mL (ref >5.9)

## 2017-07-11 LAB — I-STAT CG4 LACTIC ACID, ED
LACTIC ACID, VENOUS: 0.88 mmol/L (ref 0.5–1.9)
Lactic Acid, Venous: 1.22 mmol/L (ref 0.5–1.9)

## 2017-07-11 LAB — VITAMIN B12: Vitamin B-12: 398 pg/mL (ref 180–914)

## 2017-07-11 LAB — MAGNESIUM: MAGNESIUM: 1.8 mg/dL (ref 1.7–2.4)

## 2017-07-11 MED ORDER — PIPERACILLIN-TAZOBACTAM 3.375 G IVPB 30 MIN
3.3750 g | Freq: Once | INTRAVENOUS | Status: AC
  Administered 2017-07-11: 3.375 g via INTRAVENOUS
  Filled 2017-07-11: qty 50

## 2017-07-11 MED ORDER — POTASSIUM CHLORIDE CRYS ER 20 MEQ PO TBCR
40.0000 meq | EXTENDED_RELEASE_TABLET | ORAL | Status: AC
  Administered 2017-07-11: 40 meq via ORAL
  Filled 2017-07-11: qty 2

## 2017-07-11 MED ORDER — GERHARDT'S BUTT CREAM
1.0000 "application " | TOPICAL_CREAM | CUTANEOUS | Status: DC
  Administered 2017-07-13: 1 via TOPICAL
  Filled 2017-07-11 (×2): qty 1

## 2017-07-11 MED ORDER — ONDANSETRON HCL 4 MG/2ML IJ SOLN: 4.0000 mg | Freq: Four times a day (QID) | INTRAMUSCULAR | Status: DC | PRN

## 2017-07-11 MED ORDER — GERHARDT'S BUTT CREAM
TOPICAL_CREAM | CUTANEOUS | Status: DC
  Filled 2017-07-11: qty 1

## 2017-07-11 MED ORDER — PIPERACILLIN-TAZOBACTAM 3.375 G IVPB
3.3750 g | Freq: Three times a day (TID) | INTRAVENOUS | Status: DC
Start: 2017-07-11 — End: 2017-07-14
  Administered 2017-07-11 – 2017-07-14 (×10): 3.375 g via INTRAVENOUS
  Filled 2017-07-11 (×11): qty 50

## 2017-07-11 MED ORDER — ADULT MULTIVITAMIN W/MINERALS CH
1.0000 | ORAL_TABLET | Freq: Every day | ORAL | Status: DC
  Administered 2017-07-12 – 2017-07-14 (×3): 1 via ORAL
  Filled 2017-07-11 (×3): qty 1

## 2017-07-11 MED ORDER — ENOXAPARIN SODIUM 80 MG/0.8ML ~~LOC~~ SOLN
75.0000 mg | Freq: Every day | SUBCUTANEOUS | Status: DC
  Administered 2017-07-11 – 2017-07-13 (×4): 75 mg via SUBCUTANEOUS
  Filled 2017-07-11 (×4): qty 0.8

## 2017-07-11 MED ORDER — VANCOMYCIN HCL IN DEXTROSE 1-5 GM/200ML-% IV SOLN: 1000.0000 mg | Freq: Once | INTRAVENOUS | Status: DC

## 2017-07-11 MED ORDER — POTASSIUM CHLORIDE CRYS ER 20 MEQ PO TBCR
40.0000 meq | EXTENDED_RELEASE_TABLET | Freq: Once | ORAL | Status: AC
  Administered 2017-07-11: 40 meq via ORAL
  Filled 2017-07-11: qty 2

## 2017-07-11 MED ORDER — ONDANSETRON HCL 4 MG PO TABS: 4.0000 mg | ORAL_TABLET | Freq: Four times a day (QID) | ORAL | Status: DC | PRN

## 2017-07-11 MED ORDER — PRO-STAT SUGAR FREE PO LIQD
30.0000 mL | Freq: Two times a day (BID) | ORAL | Status: DC
  Administered 2017-07-11 – 2017-07-13 (×4): 30 mL via ORAL
  Filled 2017-07-11 (×6): qty 30

## 2017-07-11 MED ORDER — VANCOMYCIN HCL 10 G IV SOLR
2500.0000 mg | Freq: Once | INTRAVENOUS | Status: AC
  Administered 2017-07-11: 2500 mg via INTRAVENOUS
  Filled 2017-07-11: qty 500

## 2017-07-11 MED ORDER — FERROUS SULFATE 325 (65 FE) MG PO TABS
325.0000 mg | ORAL_TABLET | Freq: Two times a day (BID) | ORAL | Status: DC
  Administered 2017-07-12 – 2017-07-14 (×5): 325 mg via ORAL
  Filled 2017-07-11 (×5): qty 1

## 2017-07-11 MED ORDER — VANCOMYCIN HCL 10 G IV SOLR
1500.0000 mg | Freq: Two times a day (BID) | INTRAVENOUS | Status: DC
  Administered 2017-07-11 – 2017-07-14 (×6): 1500 mg via INTRAVENOUS
  Filled 2017-07-11 (×7): qty 1500

## 2017-07-11 MED ORDER — ACETAMINOPHEN 650 MG RE SUPP
650.0000 mg | Freq: Four times a day (QID) | RECTAL | Status: DC | PRN
Start: 2017-07-11 — End: 2017-07-14

## 2017-07-11 MED ORDER — SODIUM CHLORIDE 0.9 % IV SOLN: 2000.0000 mg | Freq: Two times a day (BID) | INTRAVENOUS | Status: DC

## 2017-07-11 MED ORDER — POTASSIUM CHLORIDE 10 MEQ/100ML IV SOLN
10.0000 meq | Freq: Once | INTRAVENOUS | Status: AC
Start: 2017-07-11 — End: 2017-07-11
  Administered 2017-07-11: 10 meq via INTRAVENOUS
  Filled 2017-07-11: qty 100

## 2017-07-11 MED ORDER — ACETAMINOPHEN 325 MG PO TABS: 650.0000 mg | ORAL_TABLET | Freq: Four times a day (QID) | ORAL | Status: DC | PRN

## 2017-07-11 NOTE — Consult Note (Signed)
WOC Nurse wound consult note Reason for Consult:Lymphedema with ulceration to right heel, plantar aspect.  Periwound is macerated and friable. Intertriginous dermatitis to abdominal pannus and groin area, extending to inner thighs, Interdry skin fold management is ordered.   Wound type:Lymphedema and intertriginous dermatitis Pressure Injury POA: NA Measurement: Right heel:  4 cm x 6 cm x 0.2 cm macerated, nonintact blister.   Wound ZOX:WRUEAbed:Ruddy red Drainage (amount, consistency, odor) Moderate serous weeping  No odor Periwound:Edema and chronic skin changes.  ABI was 0.99 right and 1.25 left (safe to compress) Dressing procedure/placement/frequency:Cleanse bilatera legs with soap and water and pat dry.  Apply Aquacel Ag to right heel.  Apply Gerhardts butt paste to both legs to repel moisture and promote healing.  Wrap from below toes to below knee with kerilx and secure with ace wrap Monday/Wednesday/Fri.  Bedside RN to perform  Measure and cut length of InterDry Ag+ to fit in skin folds that have skin breakdown Tuck InterDry  Ag+ fabric into skin folds in a single layer, allow for 2 inches of overhang from skin edges to allow for wicking to occur May remove to bathe; dry area thoroughly and then tuck into affected areas again Do not apply any creams or ointments when using InterDry Ag+ DO NOT THROW AWAY FOR 5 DAYS unless soiled with stool DO NOT [facility] Inc[facility]ASH product, this will inactivate the silver in the material  New sheet of Interdry Ag+ should be applied after 5 days of use if patient continues to have skin breakdown   Will not follow at this time.  Please re-consult if needed.  Maple HudsonKaren Adilenne Ashworth RN BSN CWON Pager [phone]

## 2017-07-11 NOTE — Progress Notes (Signed)
VASCULAR LAB PRELIMINARY  ARTERIAL  ABI completed:    RIGHT    LEFT    PRESSURE WAVEFORM  PRESSURE WAVEFORM  BRACHIAL 109 Tri BRACHIAL 96 Tri  DP 105 Tri DP 136 Tri  PT 108 Tri PT 112 Tri  GREAT TOE 64 NA GREAT TOE 54 NA    RIGHT LEFT  ABI 0.99 1.25   Bilateral resting ABI's appear within normal limits, TBI's appear abnormal bilaterally.  Chauncey FischerCharlotte C Korea Severs, RVT 07/11/2017, 9:17 AM

## 2017-07-11 NOTE — Progress Notes (Signed)
Patient seen and examined the bedside this morning.  She was admitted overnight for the management of bilateral lower extremity , abdominal wall cellulitis.  She has been started on broad-spectrum antibiotics with vancomycin and Zosyn. Patient remains comfortable.  She is afebrile at present.  Her blood pressure stable. We will continue to monitor.

## 2017-07-11 NOTE — ED Notes (Signed)
Lavender top redrawn as requested by lab.

## 2017-07-11 NOTE — Progress Notes (Signed)
Initial Nutrition Assessment  DOCUMENTATION CODES:   Morbid obesity  INTERVENTION:    30 ml Prostat BID, each supplement provides 100 kcals and 15 grams protein.   Provide MVI daily  NUTRITION DIAGNOSIS:   Increased nutrient needs related to wound healing as evidenced by estimated needs.  GOAL:   Patient will meet greater than or equal to 90% of their needs  MONITOR:   PO intake, Weight trends, Labs, Supplement acceptance  REASON FOR ASSESSMENT:   Consult Wound healing  ASSESSMENT:   Pt with PMH significant for chronic lymphedema and morbid obesity. Recently seen in the ED yesterday for c/o of worsening right leg pain and swelling. Pt left AMA and is back today for same complaints.  Pt found to have two sites of cellulitis on heel and under pannus.    Unable to wake pt upon assessment. Spoke with tech who reports pt ate 100% of her breakfast this morning. Pt will wake to eat but falls asleep soon after. ED MD reports pt had loss in appetite PTA. Will try to obtain more information once pt is more alert. No family at bedside. Weight history limited. Nutrition-Focused physical exam completed. Pt shows to have bilateral lower extremity weeping. Skin is dry and looks to be crack between leg folds. Tech reports pt is weeping under bilateral breast. Will provide MVI and supplementation to aid with wound healing.   Medications reviewed and include: IV abx Labs reviewed: K 2.9 (L) BUN <5 (L)  NUTRITION - FOCUSED PHYSICAL EXAM:    Most Recent Value  Orbital Region  No depletion  Upper Arm Region  No depletion  Thoracic and Lumbar Region  Unable to assess  Buccal Region  No depletion  Temple Region  No depletion  Clavicle Bone Region  No depletion  Clavicle and Acromion Bone Region  No depletion  Scapular Bone Region  Unable to assess  Dorsal Hand  No depletion  Patellar Region  No depletion  Anterior Thigh Region  No depletion  Posterior Calf Region  No depletion  Edema  (RD Assessment)  Mild  Hair  Reviewed  Eyes  Reviewed  Mouth  Reviewed  Skin  Reviewed  Nails  Reviewed       Diet Order:  Diet Heart Room service appropriate? Yes; Fluid consistency: Thin  EDUCATION NEEDS:   Not appropriate for education at this time  Skin:  Skin Assessment: Skin Integrity Issues: Skin Integrity Issues:: Diabetic Ulcer Diabetic Ulcer: right foot  Last BM:  07/11/17  Height:   Ht Readings from Last 1 Encounters:  07/11/17 4\' 10"  (1.473 m)    Weight:   Wt Readings from Last 1 Encounters:  07/11/17 (!) 335 lb 1.6 oz (152 kg)    Ideal Body Weight:  43.2 kg  BMI:  Body mass index is 70.04 kg/m.  Estimated Nutritional Needs:   Kcal:  1800-2000 kcal/day  Protein:  80-90 g/day  Fluid:  >1.8 L/day    Vanessa Kickarly Aleighna Wojtas RD, LDN Clinical Nutrition Pager # - [phone]

## 2017-07-11 NOTE — Progress Notes (Signed)
Pharmacy Antibiotic Note  Ellen Schroeder is a 36 y.o. female admitted on 07/11/2017 with sepsis.  Pharmacy has been consulted for zosyn and vancomycin dosing.  Plan: Zosyn 3.375g IV q8h (4 hour infusion).  Vancomycin 2500 mg x1 then 2 Gm IV q12h for est AUC=488 Goal AUC 400-500 Daily Scr F/u cultures/levels     Temp (24hrs), Avg:99 F (37.2 C), Min:98.4 F (36.9 C), Max:99.5 F (37.5 C)  Recent Labs  Lab 07/09/17 1851 07/09/17 2357 07/10/17 0050 07/10/17 0214 07/10/17 0500 07/11/17 0222 07/11/17 0229 07/11/17 0400 07/11/17 0411  WBC 17.5*  --   --   --  12.5*  --   --  13.5*  --   CREATININE 0.75  --  0.65  --  0.62 0.63  --   --   --   LATICACIDVEN  --  2.84*  --  1.30  --   --  1.22  --  0.88    CrCl cannot be calculated (Unknown ideal weight.).    No Known Allergies  Antimicrobials this admission: 12/26 zosyn >>  12/26 vancomcyin >>   Dose adjustments this admission:   Microbiology results:  BCx:   UCx:    Sputum:    MRSA PCR:   Thank you for allowing pharmacy to be a part of this patient's care.  Ellen Schroeder, Ellen Schroeder 07/11/2017 4:38 AM

## 2017-07-11 NOTE — H&P (Signed)
History and Physical    Ellen Schroeder ZOX:[id number] DOB: Feb 29, 1981 DOA: 07/11/2017  PCP: Anne Hahn, PA-C  Patient coming from: Home  I have personally briefly reviewed patient's old medical records in [facility]  Chief Complaint: Infected foot, R  HPI: Ellen Schroeder is a 36 y.o. female with medical history significant of obesity, chronic lymphedema.  Patient presented to the ED yesterday with c/o worsening R leg pain, swelling.  Patient has 2 sites of cellulitic erythema.  One is the R foot which had been worsening over the last several days until presentation yesterday morning, though it looks like ulcer may be more chronic than that (see also PCPs office visit note on 12/3.  Also has erythema, swelling, and drainage from lower abdomen for 2 days prior to presentation yesterday.  Associated fever and chills.  No pain with abdominal drainage.  R foot cellulitis pain is primarily located in heel.  No pain beyond site of redness at either site, no pain out of proportion to infection.  Patient was admitted yesterday, got started on zosyn and vanc, but then left AMA later that morning.  She returns to the ED this morning for re-admission.   ED Course: Erythema essentially the same.  Patient seems less septic than yesterday though.  Started on zosyn and vanc.  K 2.9 (getting replaced).  Lactate nl today.  WBC 13.5, HGB 7.8 down from 8.5 this AM.     Review of Systems: As per HPI otherwise 10 point review of systems negative.   Past Medical History:  Diagnosis Date  . Chronic back pain   . Lymph edema   . Morbid obesity (HCC)     History reviewed. No pertinent surgical history.   reports that  has never smoked. She does not have any smokeless tobacco history on file. She reports that she does not drink alcohol or use drugs.  No Known Allergies  Family History  Problem Relation Age of Onset  . Diabetes Mother   . Hypertension Mother   . Diabetes Father   .  Hypertension Father   . Hyperlipidemia Father      Prior to Admission medications   Medication Sig Start Date End Date Taking? Authorizing Provider  furosemide (LASIX) 20 MG tablet Take 20 mg by mouth 2 (two) times daily. 06/18/17  Yes [provider]  ibuprofen (ADVIL,MOTRIN) 800 MG tablet Take 800 mg by mouth 3 (three) times daily. 06/18/17  Yes [provider]  potassium chloride 20 MEQ/15ML (10%) SOLN Take 15 mLs by mouth daily. 06/19/17  Yes [provider]  traMADol (ULTRAM) 50 MG tablet Take 1-2 tablets by mouth every 8 (eight) hours as needed for moderate pain or severe pain. 06/18/17  Yes [provider]    Physical Exam: Vitals:   07/11/17 0130 07/11/17 0249 07/11/17 0330 07/11/17 0345  BP: 106/72   106/70  Pulse: 86  85 (!) 107  Resp: (!) 24  (!) 28 20  Temp:  98.4 F (36.9 C)    SpO2: (!) 73%  93%     Constitutional: NAD, calm, comfortable Eyes: PERRL, lids and conjunctivae normal ENMT: Mucous membranes are moist. Posterior pharynx clear of any exudate or lesions.Normal dentition.  Neck: normal, supple, no masses, no thyromegaly Respiratory: clear to auscultation bilaterally, no wheezing, no crackles. Normal respiratory effort. No accessory muscle use.  Cardiovascular: Regular rate and rhythm, no murmurs / rubs / gallops. No extremity edema. 2+ pedal pulses. No carotid bruits.  Abdomen:  no tenderness, no masses palpated. No hepatosplenomegaly. Bowel sounds positive.  Musculoskeletal: no clubbing / cyanosis. No joint deformity upper and lower extremities. Good ROM, no contractures. Normal muscle tone.  Skin: 2 separate sites of cellulitis.  One on RLE coming from ulcer on posterior aspect of R heel.  Also has paniliculitis with foul smelling discharge.  Neither site has pain beyond site of redness, severe pain out of proportion to exam findings, nor sub Q air. Neurologic: CN 2-12 grossly intact. Sensation intact, DTR normal. Strength 5/5 in  all 4.  Psychiatric: Normal judgment and insight. Alert and oriented x 3. Normal mood.    Labs on Admission: I have personally reviewed following labs and imaging studies  CBC: Recent Labs  Lab 07/09/17 1851 07/10/17 0500  WBC 17.5* 12.5*  NEUTROABS 14.3*  --   HGB 9.1* 8.5*  HCT 29.6* 27.6*  MCV 69.8* 69.9*  PLT 394 326   Basic Metabolic Panel: Recent Labs  Lab 07/09/17 1851 07/10/17 0050 07/10/17 0500 07/11/17 0222  NA 129* 137 137 135  K 2.8* 2.8* 3.5 2.9*  CL 94* 101 104 102  CO2 28 28 27 25   GLUCOSE 149* 113* 117* 124*  BUN 9 6 <5* <5*  CREATININE 0.75 0.65 0.62 0.63  CALCIUM 8.2* 7.9* 7.7* 7.9*   GFR: CrCl cannot be calculated (Unknown ideal weight.). Liver Function Tests: Recent Labs  Lab 07/09/17 1851  AST 26  ALT 15  ALKPHOS 51  BILITOT 0.7  PROT 7.6  ALBUMIN 2.6*   No results for input(s): LIPASE, AMYLASE in the last 168 hours. No results for input(s): AMMONIA in the last 168 hours. Coagulation Profile: No results for input(s): INR, PROTIME in the last 168 hours. Cardiac Enzymes: No results for input(s): CKTOTAL, CKMB, CKMBINDEX, TROPONINI in the last 168 hours. BNP (last 3 results) No results for input(s): PROBNP in the last 8760 hours. HbA1C: Recent Labs    07/10/17 0012  HGBA1C 6.2*   CBG: No results for input(s): GLUCAP in the last 168 hours. Lipid Profile: No results for input(s): CHOL, HDL, LDLCALC, TRIG, CHOLHDL, LDLDIRECT in the last 72 hours. Thyroid Function Tests: No results for input(s): TSH, T4TOTAL, FREET4, T3FREE, THYROIDAB in the last 72 hours. Anemia Panel: No results for input(s): VITAMINB12, FOLATE, FERRITIN, TIBC, IRON, RETICCTPCT in the last 72 hours. Urine analysis: No results found for: COLORURINE, APPEARANCEUR, LABSPEC, PHURINE, GLUCOSEU, HGBUR, BILIRUBINUR, KETONESUR, PROTEINUR, UROBILINOGEN, NITRITE, LEUKOCYTESUR  Radiological Exams on Admission: Dg Tibia/fibula Right  Result Date: 07/09/2017 CLINICAL  DATA:  Leg wounds EXAM: RIGHT TIBIA AND FIBULA - 2 VIEW COMPARISON:  None. FINDINGS: No fracture or malalignment. No periostitis or bone destruction. Diffuse soft tissue swelling. No soft tissue gas IMPRESSION: No acute osseous abnormality Electronically Signed   By: Jasmine PangKim  Fujinaga M.D.   On: 07/09/2017 20:57   Ct Abdomen Pelvis W Contrast  Result Date: 07/09/2017 CLINICAL DATA:  36 year old female with nausea vomiting. EXAM: CT ABDOMEN AND PELVIS WITH CONTRAST TECHNIQUE: Multidetector CT imaging of the abdomen and pelvis was performed using the standard protocol following bolus administration of intravenous contrast. CONTRAST:  100mL ISOVUE-300 IOPAMIDOL (ISOVUE-300) INJECTION 61% COMPARISON:  None. FINDINGS: Lower chest: Minimal bibasilar atelectatic changes. There is herniation of small amount of mesenteric fat through the diaphragmatic hiatus. No intra-abdominal free air or free fluid. Hepatobiliary: There is diffuse fatty infiltration of the liver. No intrahepatic biliary ductal dilatation. The gallbladder is mildly distended. Tiny stones or sludge may be present in the gallbladder fundus. No  pericholecystic fluid or evidence of acute cholecystitis by CT. Pancreas: Unremarkable. No pancreatic ductal dilatation or surrounding inflammatory changes. Spleen: Normal in size without focal abnormality. Adrenals/Urinary Tract: The adrenal glands are unremarkable. There is no hydronephrosis on either side. The visualized ureters and urinary bladder appear unremarkable. Stomach/Bowel: There is no bowel obstruction or active inflammation. Normal appendix. Vascular/Lymphatic: No significant vascular findings are present. No enlarged abdominal or pelvic lymph nodes. Reproductive: The uterus is anteverted and grossly unremarkable. The ovaries appear unremarkable as well. No pelvic mass. Other: There is thickening of the skin and stranding of the subcutaneous soft tissues of the anterior pelvic wall. No fluid collection.  Musculoskeletal: No acute or significant osseous findings. IMPRESSION: 1. No bowel obstruction or active inflammation.  Normal appendix. 2. Fatty liver. Electronically Signed   By: Elgie CollardArash  Radparvar M.D.   On: 07/09/2017 22:08   Dg Foot Complete Right  Result Date: 07/09/2017 CLINICAL DATA:  Right leg wounds EXAM: RIGHT FOOT COMPLETE - 3+ VIEW COMPARISON:  12/31/2005 FINDINGS: No fracture or malalignment. No soft tissue gas. No periostitis or bone destruction. IMPRESSION: No acute osseous abnormality. Electronically Signed   By: Jasmine PangKim  Fujinaga M.D.   On: 07/09/2017 20:56   Dg Femur Min 2 Views Right  Result Date: 07/09/2017 CLINICAL DATA:  Leg pain EXAM: RIGHT FEMUR 2 VIEWS COMPARISON:  None. FINDINGS: The study is limited by habitus. No fracture or malalignment. Mild degenerative changes at the knee. Soft tissue swelling. No soft tissue gas. No periostitis or bone destruction IMPRESSION: No definite acute osseous abnormality. Electronically Signed   By: Jasmine PangKim  Fujinaga M.D.   On: 07/09/2017 20:58    EKG: Independently reviewed.  Assessment/Plan Principal Problem:   Cellulitis of right leg Active Problems:   Panniculitis   Hypokalemia   Anemia    1. 2 sites of cellulitis - sepsis now improved 1. Resume Zosyn / vanc 2. LE wound pathway 3. BCx pending 4. Wound care consult 2. Anemia - 1. HGB baseline seems to be about 8 chronically 2. Continue to monitor with daily CBCs while here 3. Transfuse if less than 7 4. Sending anemia panel 3. Hypokalemia - replacing potassium  DVT prophylaxis: Lovenox Code Status: Full Family Communication: No family in room Disposition Plan: Home after admit Consults called: None Admission status: Admit to inpatient   Hillary BowGARDNER, Sundy Houchins M. DO [facility] Pager [phone]  If 7AM-7PM, please contact day team taking care of patient [email or web address] Password [email or web address]  07/11/2017, 4:25 AM

## 2017-07-11 NOTE — ED Notes (Signed)
First attempt to call report made.  

## 2017-07-11 NOTE — Progress Notes (Signed)
Pharmacy Antibiotic Note  Ellen NicksSavoeuth Schroeder is a 36 y.o. female admitted on 07/11/2017 with sepsis.  Pharmacy was consulted for zosyn and vancomycin dosing.  Today, 07/11/2017: Updated weight and height information available as documented below  Plan: Reduce vancomycin maintenance dosage to 1500 mg IV q12h for est AUC 455 using normalized CrCl Zosyn 3.375g IV q8h (4 hour infusion).  Goal AUC 400-500 Daily SCr F/u cultures/levels  Height: 4\' 10"  (147.3 cm) Weight: (!) 335 lb 1.6 oz (152 kg) IBW/kg (Calculated) : 40.9  Temp (24hrs), Avg:98.2 F (36.8 C), Min:98 F (36.7 C), Max:98.4 F (36.9 C)  Recent Labs  Lab 07/09/17 1851 07/09/17 2357 07/10/17 0050 07/10/17 0214 07/10/17 0500 07/11/17 0222 07/11/17 0229 07/11/17 0400 07/11/17 0411  WBC 17.5*  --   --   --  12.5*  --   --  13.5*  --   CREATININE 0.75  --  0.65  --  0.62 0.63  --   --   --   LATICACIDVEN  --  2.84*  --  1.30  --   --  1.22  --  0.88    Estimated Creatinine Clearance, Normalized:   138 mL/min/72 kg    No Known Allergies  Antimicrobials this admission: 12/26 zosyn >>  12/26 vancomycin >>   Dose adjustments this admission: 12/26 vancomycin maint dosage reduced from 2 g q12h to 1.5 g q12h  Microbiology results:  BCx: 12/24: ngtd    Thank you for allowing pharmacy to be a part of this patient's care.  Elie Goodyandy Woodruff Skirvin, PharmD, BCPS Pager: [phone] 07/11/2017  8:32 AM

## 2017-07-11 NOTE — ED Triage Notes (Addendum)
Patient brought in by EMS from home. Patient was here earlier for the same complaint but left AMA. Patient here for leg swelling and pain to her legs.

## 2017-07-11 NOTE — ED Notes (Signed)
Bed: WA23 Expected date:  Expected time:  Means of arrival:  Comments: 

## 2017-07-11 NOTE — Progress Notes (Signed)
Date: July 11, 2017 Rhonda Davis, BSN, RN3, CCM [phone] Chart and notes review for patient progress and needs. Will follow for case management and discharge needs. Next review date: [id number] 

## 2017-07-11 NOTE — Progress Notes (Signed)
Pt rolls in bed with assist of 3. Interdry applied under breasts and under abdominal folds.

## 2017-07-11 NOTE — Progress Notes (Signed)
Rx Brief note: Lovenox  rx adjusted Lovenox to 75 mg daily in pt with BMI>30  Thanks Lorenza EvangelistGreen, Salvadore Valvano R ,07/11/2017 6:32 AM

## 2017-07-11 NOTE — ED Provider Notes (Signed)
[facility]-EMERGENCY DEPT Provider Note   CSN: [id number] Arrival date & time: 07/11/17  0030     History   Chief Complaint Chief Complaint  Patient presents with  . Leg Swelling  . Leg Pain    HPI Ellen NicksSavoeuth Schroeder is a 36 y.o. female.  The history is provided by the patient.  She has history of morbid obesity, lymphedema and was in emergency yesterday with sepsis from cellulitis and panniculitis and was admitted to the hospital.  She left AGAINST MEDICAL ADVICE this morning, but states that she is having increasing pain in her right leg and returns to be admitted.  She states that she will stay until she is better.  She rates pain at 8/10.  She denies fever or chills.  Of note, she states she was not given any antibiotic prescriptions when she left AGAINST MEDICAL ADVICE.  Past Medical History:  Diagnosis Date  . Chronic back pain   . Lymph edema   . Morbid obesity [facility](HCC)     Patient Active Problem List   Diagnosis Date Noted  . Sepsis (HCC) 07/10/2017  . Cellulitis of right leg 07/10/2017  . 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 No pertinent surgical history.  OB History    No data available       Home Medications    Prior to Admission medications   Medication Sig Start Date End Date Taking? Authorizing Provider  furosemide (LASIX) 20 MG tablet Take 20 mg by mouth 2 (two) times daily. 06/18/17  Yes [provider]  ibuprofen (ADVIL,MOTRIN) 800 MG tablet Take 800 mg by mouth 3 (three) times daily. 06/18/17  Yes [provider]  potassium chloride 20 MEQ/15ML (10%) SOLN Take 15 mLs by mouth daily. 06/19/17  Yes [provider]  traMADol (ULTRAM) 50 MG tablet Take 1-2 tablets by mouth every 8 (eight) hours as needed for moderate pain or severe pain. 06/18/17  Yes [provider]    Family History Family History  Problem Relation Age of Onset  . Diabetes Mother   . Hypertension Mother   . Diabetes  Father   . Hypertension Father   . Hyperlipidemia Father     Social History Social History   Tobacco Use  . Smoking status: Never Smoker  Substance Use Topics  . Alcohol use: No    Frequency: Never  . Drug use: No     Allergies   Patient has no known allergies.   Review of Systems Review of Systems  All other systems reviewed and are negative.    Physical Exam Updated Vital Signs BP 106/70   Pulse (!) 107   Temp 98.4 F (36.9 C)   Resp 20   SpO2 93%   Physical Exam  Nursing note and vitals reviewed.  Morbidly obese 36 year old female, resting comfortably and in no acute distress. Vital signs are significant for mild tachycardia. Oxygen saturation is 100%, which is normal. Head is normocephalic and atraumatic. PERRLA, EOMI. Oropharynx is clear. Neck is nontender and supple without adenopathy or JVD. Back is nontender and there is no CVA tenderness. Lungs are clear without rales, wheezes, or rhonchi. Chest is nontender. Heart has regular rate and rhythm without murmur. Abdomen is soft, flat, nontender without masses or hepatosplenomegaly and peristalsis is normoactive.  There is erythema with skin breakdown of her abdominal pannus at the inguinal folds-right worse than left. Extremities severe lymphedema.  There is desquamation of the plantar surface of the right  heel with serous drainage.  Right foot is erythematous and warm to touch and very tender. Skin is warm and dry without rash. Neurologic: Mental status is normal, cranial nerves are intact, there are no motor or sensory deficits.  ED Treatments / Results  Labs (all labs ordered are listed, but only abnormal results are displayed) Labs Reviewed  BASIC METABOLIC PANEL - Abnormal; Notable for the following components:      Result Value   Potassium 2.9 (*)    Glucose, Bld 124 (*)    BUN <5 (*)    Calcium 7.9 (*)    All other components within normal limits  CBC WITH DIFFERENTIAL/PLATELET  CBC WITH  DIFFERENTIAL/PLATELET  I-STAT CG4 LACTIC ACID, ED  I-STAT CG4 LACTIC ACID, ED    EKG  EKG Interpretation None       Radiology Dg Tibia/fibula Right  Result Date: 07/09/2017 CLINICAL DATA:  Leg wounds EXAM: RIGHT TIBIA AND FIBULA - 2 VIEW COMPARISON:  None. FINDINGS: No fracture or malalignment. No periostitis or bone destruction. Diffuse soft tissue swelling. No soft tissue gas IMPRESSION: No acute osseous abnormality Electronically Signed   By: Jasmine PangKim  Fujinaga M.D.   On: 07/09/2017 20:57   Ct Abdomen Pelvis W Contrast  Result Date: 07/09/2017 CLINICAL DATA:  36 year old female with nausea vomiting. EXAM: CT ABDOMEN AND PELVIS WITH CONTRAST TECHNIQUE: Multidetector CT imaging of the abdomen and pelvis was performed using the standard protocol following bolus administration of intravenous contrast. CONTRAST:  100mL ISOVUE-300 IOPAMIDOL (ISOVUE-300) INJECTION 61% COMPARISON:  None. FINDINGS: Lower chest: Minimal bibasilar atelectatic changes. There is herniation of small amount of mesenteric fat through the diaphragmatic hiatus. No intra-abdominal free air or free fluid. Hepatobiliary: There is diffuse fatty infiltration of the liver. No intrahepatic biliary ductal dilatation. The gallbladder is mildly distended. Tiny stones or sludge may be present in the gallbladder fundus. No pericholecystic fluid or evidence of acute cholecystitis by CT. Pancreas: Unremarkable. No pancreatic ductal dilatation or surrounding inflammatory changes. Spleen: Normal in size without focal abnormality. Adrenals/Urinary Tract: The adrenal glands are unremarkable. There is no hydronephrosis on either side. The visualized ureters and urinary bladder appear unremarkable. Stomach/Bowel: There is no bowel obstruction or active inflammation. Normal appendix. Vascular/Lymphatic: No significant vascular findings are present. No enlarged abdominal or pelvic lymph nodes. Reproductive: The uterus is anteverted and grossly  unremarkable. The ovaries appear unremarkable as well. No pelvic mass. Other: There is thickening of the skin and stranding of the subcutaneous soft tissues of the anterior pelvic wall. No fluid collection. Musculoskeletal: No acute or significant osseous findings. IMPRESSION: 1. No bowel obstruction or active inflammation.  Normal appendix. 2. Fatty liver. Electronically Signed   By: Elgie CollardArash  Radparvar M.D.   On: 07/09/2017 22:08   Dg Foot Complete Right  Result Date: 07/09/2017 CLINICAL DATA:  Right leg wounds EXAM: RIGHT FOOT COMPLETE - 3+ VIEW COMPARISON:  12/31/2005 FINDINGS: No fracture or malalignment. No soft tissue gas. No periostitis or bone destruction. IMPRESSION: No acute osseous abnormality. Electronically Signed   By: Jasmine PangKim  Fujinaga M.D.   On: 07/09/2017 20:56   Dg Femur Min 2 Views Right  Result Date: 07/09/2017 CLINICAL DATA:  Leg pain EXAM: RIGHT FEMUR 2 VIEWS COMPARISON:  None. FINDINGS: The study is limited by habitus. No fracture or malalignment. Mild degenerative changes at the knee. Soft tissue swelling. No soft tissue gas. No periostitis or bone destruction IMPRESSION: No definite acute osseous abnormality. Electronically Signed   By: Adrian ProwsKim  Fujinaga M.D.  On: 07/09/2017 20:58    Procedures Procedures (including critical care time)  Medications Ordered in ED Medications  potassium chloride 10 mEq in 100 mL IVPB (not administered)  potassium chloride SA (K-DUR,KLOR-CON) CR tablet 40 mEq (not administered)  vancomycin (VANCOCIN) IVPB 1000 mg/200 mL premix (not administered)  piperacillin-tazobactam (ZOSYN) IVPB 3.375 g (not administered)     Initial Impression / Assessment and Plan / ED Course  I have reviewed the triage vital signs and the nursing notes.  Pertinent labs & imaging results that were available during my care of the patient were reviewed by me and considered in my medical decision making (see chart for details).  Cellulitis and panniculitis.  Old records  are reviewed confirming ED visit yesterday at which time she had severe hypokalemia and also had elevated lactic acid and had been admitted to the stepdown unit for's sepsis from cellulitis and panniculitis.  She left AGAINST MEDICAL ADVICE, potassium had been normalized prior to discharge.  I do not see any note from the physician regarding AMA, and can find no documentation of prescriptions having been given.  Will recheck CBC and metabolic panel and lactic acid.  She will need to be admitted and is agreeable to staying until she has clinical improvement.  Lactic acid level has come back normal.  Potassium has come back very low and she is given additional intravenous and oral potassium.  She is started back on antibiotics of vancomycin and Zosyn.  Case is discussed with Dr. Julian Reil of [facility] who agrees to admit the patient.  Final Clinical Impressions(s) / ED Diagnoses   Final diagnoses:  Cellulitis of right lower extremity  Panniculitis  Hypokalemia  Lymphedema  Morbid obesity [facility])    ED Discharge Orders    None       Dione Booze, MD 07/11/17 [phone]

## 2017-07-12 ENCOUNTER — Encounter (HOSPITAL_COMMUNITY): Payer: Self-pay | Admitting: Internal Medicine

## 2017-07-12 DIAGNOSIS — J45901 Unspecified asthma with (acute) exacerbation: Secondary | ICD-10-CM

## 2017-07-12 DIAGNOSIS — I890 Lymphedema, not elsewhere classified: Secondary | ICD-10-CM

## 2017-07-12 LAB — CBC WITH DIFFERENTIAL/PLATELET
BASOS PCT: 0 %
Basophils Absolute: 0 10*3/uL (ref 0.0–0.1)
EOS ABS: 0.3 10*3/uL (ref 0.0–0.7)
Eosinophils Relative: 3 %
HCT: 26.8 % — ABNORMAL LOW (ref 36.0–46.0)
Hemoglobin: 7.9 g/dL — ABNORMAL LOW (ref 12.0–15.0)
LYMPHS PCT: 25 %
Lymphs Abs: 2.8 10*3/uL (ref 0.7–4.0)
MCH: 21.2 pg — ABNORMAL LOW (ref 26.0–34.0)
MCHC: 29.5 g/dL — ABNORMAL LOW (ref 30.0–36.0)
MCV: 72 fL — AB (ref 78.0–100.0)
Monocytes Absolute: 0.8 10*3/uL (ref 0.1–1.0)
Monocytes Relative: 7 %
NEUTROS PCT: 65 %
Neutro Abs: 7.4 10*3/uL (ref 1.7–7.7)
PLATELETS: 424 10*3/uL — AB (ref 150–400)
RBC: 3.72 MIL/uL — ABNORMAL LOW (ref 3.87–5.11)
RDW: 19.2 % — ABNORMAL HIGH (ref 11.5–15.5)
WBC: 11.3 10*3/uL — ABNORMAL HIGH (ref 4.0–10.5)

## 2017-07-12 LAB — BASIC METABOLIC PANEL
ANION GAP: 6 (ref 5–15)
BUN: <5 mg/dL — ABNORMAL LOW (ref 6–20)
CO2: 28 mmol/L (ref 22–32)
Calcium: 8 mg/dL — ABNORMAL LOW (ref 8.9–10.3)
Chloride: 105 mmol/L (ref 101–111)
Creatinine, Ser: 0.61 mg/dL (ref 0.44–1.00)
GLUCOSE: 131 mg/dL — AB (ref 65–99)
POTASSIUM: 3.6 mmol/L (ref 3.5–5.1)
SODIUM: 139 mmol/L (ref 135–145)

## 2017-07-12 MED ORDER — IPRATROPIUM-ALBUTEROL 0.5-2.5 (3) MG/3ML IN SOLN: 3.0000 mL | Freq: Four times a day (QID) | RESPIRATORY_TRACT | Status: DC | PRN

## 2017-07-12 MED ORDER — DIPHENHYDRAMINE HCL 25 MG PO CAPS
25.0000 mg | ORAL_CAPSULE | Freq: Four times a day (QID) | ORAL | Status: DC | PRN
  Filled 2017-07-12: qty 1

## 2017-07-12 MED ORDER — NYSTATIN 100000 UNIT/GM EX POWD
Freq: Three times a day (TID) | CUTANEOUS | Status: DC
  Administered 2017-07-12 – 2017-07-14 (×5): via TOPICAL
  Filled 2017-07-12: qty 15

## 2017-07-12 NOTE — H&P (Signed)
PROGRESS NOTE    Ellen Schroeder  WUJ:[id number] DOB: 09/08/1980 DOA: 07/11/2017 PCP: Anne HahnBoyd, Timothy A, PA-C   Brief Narrative: Patient is a 36 year old female with past medical history of morbid obesity, chronic lymphedema of lower extremity patient is the emergency department with worsening bilateral lower extremity pain and edema.  She also has inflammation of her abdominal wall suggestive of panniculitis.  Patient admitted for IV antibiotics and wound care.  Assessment & Plan:   Principal Problem:   Cellulitis of right leg Active Problems:   Panniculitis   Anemia   Lymphedema of lower extremity   Asthma, chronic, unspecified asthma severity, with acute exacerbation   Cellulitis of lower extremities: Started on Vanco and Zosyn.    Had mild leukocytosis. Blood cultures no growth till date. She has remained afebrile. We will continue IV antibiotics.  Wound care following.  Anemia:Iron studies shows iron deficiency anemia.  Started on iron supplementation.  Chronic lymphedema/debility: We will consult physical therapy.  Patient states she is unable to ambulate.  Asthma: Continue bronchodilators as necessary   DVT prophylaxis: Lovenox Code Status: Full Family Communication: None of the family members present at the bedside Disposition Plan: Home with home health in 1-2 days    Consultants: None  Procedures:None  Antimicrobials: Vancomycin and Zosyn since 12/26  Subjective: Patient seen and examined the bedside this morning.  Abdominal and bilateral lower extremity silhouette is slowly improving.  She was noted to have wheezes this morning   Objective: Vitals:   07/11/17 1440 07/11/17 2116 07/12/17 0509 07/12/17 1503  BP: 118/67 (!) 109/58 103/65 116/65  Pulse: 95 94 88 86  Resp: 20 18 19 18   Temp: 98.3 F (36.8 C) 98.9 F (37.2 C) 97.6 F (36.4 C) 98.3 F (36.8 C)  TempSrc: Oral Oral Oral Oral  SpO2: 99% 96% 93% 91%  Weight:      Height:         Intake/Output Summary (Last 24 hours) at 07/12/2017 1511 Last data filed at 07/12/2017 [id number] Gross per 24 hour  Intake 890 ml  Output 1100 ml  Net -210 ml   Filed Weights   07/11/17 0527 07/11/17 0631  Weight: (!) 156 kg (344 lb) (!) 152 kg (335 lb 1.6 oz)    Examination:  General exam: Appears calm and comfortable ,Not in distress, morbidly obese  respiratory system: Bilateral expiratory wheezes  cardiovascular system: S1 & S2 heard, RRR. No JVD, murmurs, rubs, gallops or clicks. No pedal edema. Gastrointestinal system: Abdomen is nondistended, soft and nontender. No organomegaly or masses felt. Normal bowel sounds heard. Panniculitis with shallow ulcers Central nervous system: Alert and oriented. No focal neurological deficits. Extremities: Severe lymphedema, redness, shallow ulcers Skin: No rashes, lesions or ulcers,no icterus ,no pallor Psychiatry: Judgement and insight appear normal. Mood & affect appropriate.     Data Reviewed: I have personally reviewed following labs and imaging studies  CBC: Recent Labs  Lab 07/09/17 1851 07/10/17 0500 07/11/17 0400 07/12/17 0336  WBC 17.5* 12.5* 13.5* 11.3*  NEUTROABS 14.3*  --  10.1* 7.4  HGB 9.1* 8.5* 7.8* 7.9*  HCT 29.6* 27.6* 25.8* 26.8*  MCV 69.8* 69.9* 70.7* 72.0*  PLT 394 326 361 424*   Basic Metabolic Panel: Recent Labs  Lab 07/09/17 1851 07/10/17 0050 07/10/17 0500 07/11/17 0222 07/11/17 0501 07/12/17 0336  NA 129* 137 137 135  --  139  K 2.8* 2.8* 3.5 2.9* 3.1* 3.6  CL 94* 101 104 102  --  105  CO2 28  28 27 25   --  28  GLUCOSE 149* 113* 117* 124*  --  131*  BUN 9 6 <5* <5*  --  <5*  CREATININE 0.75 0.65 0.62 0.63  --  0.61  CALCIUM 8.2* 7.9* 7.7* 7.9*  --  8.0*  MG  --   --   --   --  1.8  --    GFR: Estimated Creatinine Clearance: 129.7 mL/min (by C-G formula based on SCr of 0.61 mg/dL). Liver Function Tests: Recent Labs  Lab 07/09/17 1851  AST 26  ALT 15  ALKPHOS 51  BILITOT 0.7  PROT  7.6  ALBUMIN 2.6*   No results for input(s): LIPASE, AMYLASE in the last 168 hours. No results for input(s): AMMONIA in the last 168 hours. Coagulation Profile: No results for input(s): INR, PROTIME in the last 168 hours. Cardiac Enzymes: No results for input(s): CKTOTAL, CKMB, CKMBINDEX, TROPONINI in the last 168 hours. BNP (last 3 results) No results for input(s): PROBNP in the last 8760 hours. HbA1C: Recent Labs    07/10/17 0012  HGBA1C 6.2*   CBG: No results for input(s): GLUCAP in the last 168 hours. Lipid Profile: No results for input(s): CHOL, HDL, LDLCALC, TRIG, CHOLHDL, LDLDIRECT in the last 72 hours. Thyroid Function Tests: No results for input(s): TSH, T4TOTAL, FREET4, T3FREE, THYROIDAB in the last 72 hours. Anemia Panel: Recent Labs    07/11/17 0501  VITAMINB12 398  FOLATE 6.1  FERRITIN 44  TIBC 210*  IRON 11*  RETICCTPCT 1.3   Sepsis Labs: Recent Labs  Lab 07/09/17 2357 07/10/17 0214 07/11/17 0229 07/11/17 0411  LATICACIDVEN 2.84* 1.30 1.22 0.88    Recent Results (from the past 240 hour(s))  Blood culture (routine x 2)     Status: None (Preliminary result)   Collection Time: 07/09/17  6:51 PM  Result Value Ref Range Status   Specimen Description BLOOD RIGHT ANTECUBITAL  Final   Special Requests   Final    BOTTLES DRAWN AEROBIC AND ANAEROBIC Blood Culture adequate volume   Culture   Final    NO GROWTH 3 DAYS Performed at [facility], [address], [place], [place] [address]    Report Status PENDING  Incomplete  Blood culture (routine x 2)     Status: None (Preliminary result)   Collection Time: 07/09/17  6:56 PM  Result Value Ref Range Status   Specimen Description BLOOD LEFT HAND  Final   Special Requests   Final    BOTTLES DRAWN AEROBIC AND ANAEROBIC Blood Culture adequate volume   Culture   Final    NO GROWTH 3 DAYS Performed at [facility], [address], [place], [place] [address]    Report Status PENDING  Incomplete          Radiology Studies: No results found.      Scheduled Meds: . enoxaparin (LOVENOX) injection  75 mg Subcutaneous Daily  . feeding supplement (PRO-STAT SUGAR FREE 64)  30 mL Oral BID  . ferrous sulfate  325 mg Oral BID WC  . [START ON 07/13/2017] Gerhardt's butt cream  1 application Topical Once per day on Mon Wed Fri  . multivitamin with minerals  1 tablet Oral Daily   Continuous Infusions: . piperacillin-tazobactam (ZOSYN)  IV 3.375 g (07/12/17 1340)  . vancomycin Stopped (07/12/17 1227)     LOS: 1 day    Time spent:     Meredith Leeds, MD [facility] Pager [phone]  If 7PM-7AM,  please contact night-coverage [email or web address] Password [email or web address] 07/12/2017, 3:11 PM

## 2017-07-13 LAB — CREATININE, SERUM: CREATININE: 0.72 mg/dL (ref 0.44–1.00)

## 2017-07-13 NOTE — Progress Notes (Signed)
CSW consult-"Access Medications at home."  RNCM following to assist with medications at home.   Vivi BarrackNicole Holle Sprick, Theresia MajorsLCSWA, MSW Clinical Social Worker  [phone] 07/13/2017  2:34 PM

## 2017-07-13 NOTE — Progress Notes (Signed)
PROGRESS NOTE    Ellen Schroeder  WGN:[id number] DOB: 11/20/1980 DOA: 07/11/2017 PCP: Anne HahnBoyd, Timothy A, PA-C   Brief Narrative: Patient is a 36 year old female with past medical history of morbid obesity, chronic lymphedema of lower extremity patient is the emergency department with worsening bilateral lower extremity pain and edema.  She also has severe  inflammation of her abdominal wall suggestive of panniculitis.  Patient admitted for IV antibiotics and wound care.  Assessment & Plan:   Principal Problem:   Cellulitis of right leg Active Problems:   Panniculitis   Anemia   Lymphedema of lower extremity   Asthma, chronic, unspecified asthma severity, with acute exacerbation   Cellulitis of lower extremities/panniculitis: Started on Vanco and Zosyn.    Had mild leukocytosis.  She has remained afebrile. We will continue IV antibiotics for now until discharge.  Wound care was following.  Needs home health on discharge for the dressing of her wounds. She has an ulcer on the right heel which was still foul-smelling and draining.  Wound care requested for revisit today.  No plan for any debridement.  Anemia:Iron studies shows iron deficiency anemia.  Started on iron supplementation.  Chronic lymphedema/debility: We will consult physical therapy.  Patient states she is unable to ambulate.  Waiting for physical therapy evaluation.  Asthma: Continue bronchodilators as necessary. She needs albuterol inhaler on discharge.   DVT prophylaxis: Lovenox Code Status: Full Family Communication: None of the family members present at the bedside Disposition Plan: Home with home health in 1-2 days after PT evaluation    Consultants: None  Procedures:None  Antimicrobials: Vancomycin and Zosyn since 12/26  Subjective: Patient seen and examined at the bedside this morning.  Abdominal and bilateral lower extremity cellulitis  look improved.  Right foot ulcer still draining and was foul-smelling  this morning. She is waiting for physical therapy evaluation.  Patient requesting for large-size bed and wheelchair at home.  She needs to  be discharged on home health when she is ready.  Likely tomorrow or day after tomorrow.CM notified.   Objective: Vitals:   07/12/17 0509 07/12/17 1503 07/12/17 2047 07/13/17 0407  BP: 103/65 116/65 118/60 106/62  Pulse: 88 86 91 95  Resp: 19 18 18 16   Temp: 97.6 F (36.4 C) 98.3 F (36.8 C) 98.6 F (37 C) 98.2 F (36.8 C)  TempSrc: Oral Oral Oral Oral  SpO2: 93% 91% 92% 91%  Weight:      Height:        Intake/Output Summary (Last 24 hours) at 07/13/2017 1415 Last data filed at 07/13/2017 0644 Gross per 24 hour  Intake 240 ml  Output 4150 ml  Net -3910 ml   Filed Weights   07/11/17 0527 07/11/17 0631  Weight: (!) 156 kg (344 lb) (!) 152 kg (335 lb 1.6 oz)    Examination:  General exam: Appears calm and comfortable ,Not in distress, morbidly obese Respiratory system: Bilateral equal air entry, normal vesicular breath sounds, no wheezes or crackles , mild bilateral expiratory wheezes  cardiovascular system: S1 & S2 heard, RRR. No JVD, murmurs, rubs, gallops or clicks. No pedal edema. Gastrointestinal system: Abdomen is nondistended, soft and nontender. No organomegaly or masses felt. Normal bowel sounds heard. Central nervous system: Alert and oriented. No focal neurological deficits. Extremities: Severe lymphedema in bilateral lower extremities with folds, right heel ulcer. Skin: No cyanosis,No pallor,No Ulcer Panniculitis Psychiatry: Judgement and insight appear normal. Mood & affect appropriate.     Data Reviewed: I have personally reviewed  following labs and imaging studies  CBC: Recent Labs  Lab 07/09/17 1851 07/10/17 0500 07/11/17 0400 07/12/17 0336  WBC 17.5* 12.5* 13.5* 11.3*  NEUTROABS 14.3*  --  10.1* 7.4  HGB 9.1* 8.5* 7.8* 7.9*  HCT 29.6* 27.6* 25.8* 26.8*  MCV 69.8* 69.9* 70.7* 72.0*  PLT 394 326 361 424*    Basic Metabolic Panel: Recent Labs  Lab 07/09/17 1851 07/10/17 0050 07/10/17 0500 07/11/17 0222 07/11/17 0501 07/12/17 0336 07/13/17 0332  NA 129* 137 137 135  --  139  --   K 2.8* 2.8* 3.5 2.9* 3.1* 3.6  --   CL 94* 101 104 102  --  105  --   CO2 28 28 27 25   --  28  --   GLUCOSE 149* 113* 117* 124*  --  131*  --   BUN 9 6 <5* <5*  --  <5*  --   CREATININE 0.75 0.65 0.62 0.63  --  0.61 0.72  CALCIUM 8.2* 7.9* 7.7* 7.9*  --  8.0*  --   MG  --   --   --   --  1.8  --   --    GFR: Estimated Creatinine Clearance: 129.7 mL/min (by C-G formula based on SCr of 0.72 mg/dL). Liver Function Tests: Recent Labs  Lab 07/09/17 1851  AST 26  ALT 15  ALKPHOS 51  BILITOT 0.7  PROT 7.6  ALBUMIN 2.6*   No results for input(s): LIPASE, AMYLASE in the last 168 hours. No results for input(s): AMMONIA in the last 168 hours. Coagulation Profile: No results for input(s): INR, PROTIME in the last 168 hours. Cardiac Enzymes: No results for input(s): CKTOTAL, CKMB, CKMBINDEX, TROPONINI in the last 168 hours. BNP (last 3 results) No results for input(s): PROBNP in the last 8760 hours. HbA1C: No results for input(s): HGBA1C in the last 72 hours. CBG: No results for input(s): GLUCAP in the last 168 hours. Lipid Profile: No results for input(s): CHOL, HDL, LDLCALC, TRIG, CHOLHDL, LDLDIRECT in the last 72 hours. Thyroid Function Tests: No results for input(s): TSH, T4TOTAL, FREET4, T3FREE, THYROIDAB in the last 72 hours. Anemia Panel: Recent Labs    07/11/17 0501  VITAMINB12 398  FOLATE 6.1  FERRITIN 44  TIBC 210*  IRON 11*  RETICCTPCT 1.3   Sepsis Labs: Recent Labs  Lab 07/09/17 2357 07/10/17 0214 07/11/17 0229 07/11/17 0411  LATICACIDVEN 2.84* 1.30 1.22 0.88    Recent Results (from the past 240 hour(s))  Blood culture (routine x 2)     Status: None (Preliminary result)   Collection Time: 07/09/17  6:51 PM  Result Value Ref Range Status   Specimen Description BLOOD  RIGHT ANTECUBITAL  Final   Special Requests   Final    BOTTLES DRAWN AEROBIC AND ANAEROBIC Blood Culture adequate volume   Culture   Final    NO GROWTH 3 DAYS Performed at [facility], [address] [place], [place] [address]    Report Status PENDING  Incomplete  Blood culture (routine x 2)     Status: None (Preliminary result)   Collection Time: 07/09/17  6:56 PM  Result Value Ref Range Status   Specimen Description BLOOD LEFT HAND  Final   Special Requests   Final    BOTTLES DRAWN AEROBIC AND ANAEROBIC Blood Culture adequate volume   Culture   Final    NO GROWTH 3 DAYS Performed at [facility], [address], [place], [place] [address]  Report Status PENDING  Incomplete         Radiology Studies: No results found.      Scheduled Meds: . enoxaparin (LOVENOX) injection  75 mg Subcutaneous Daily  . feeding supplement (PRO-STAT SUGAR FREE 64)  30 mL Oral BID  . ferrous sulfate  325 mg Oral BID WC  . Gerhardt's butt cream  1 application Topical Once per day on Mon Wed Fri  . multivitamin with minerals  1 tablet Oral Daily  . nystatin   Topical TID   Continuous Infusions: . piperacillin-tazobactam (ZOSYN)  IV 3.375 g (07/13/17 1352)  . vancomycin Stopped (07/13/17 1322)     LOS: 2 days    Time spent:     Meredith LeedsAmrit Marnell Mcdaniel BK, MD [facility] Pager [phone]  If 7PM-7AM, please contact night-coverage [email or web address] Password [email or web address] 07/13/2017, 2:15 PM

## 2017-07-13 NOTE — Progress Notes (Signed)
    Durable Medical Equipment  (From admission, onward)        Start     Ordered   07/13/17 1508  For home use only DME Wheelchair electric  Once     07/13/17 1510   07/13/17 1507  For home use only DME Hospital bed  Once    Question:  Bed type  Answer:  Semi-electric   07/13/17 1510

## 2017-07-13 NOTE — Consult Note (Signed)
WOC Nurse wound follow up: requested by rounding MD  Wound type: venous insufficiency/lymphedema.  Right heel ruptured blister. Improved since initial assessment by my partner Marcellina MillinK Sanders on 07/11/17, Measurement:Unchanges (4cm x 6cm x 0.2cm) Wound bed: Pink, moist Drainage (amount, consistency, odor) Dressing has just been changed and taken down for second time, no new drainage.  Periwound:No maceration today. Dressing procedure/placement/frequency: The POC in place is working to dry and support heel, but patient is immobile in bed and in the supine position with feet rotated laterally. She is incontinent and there is mild IAD as well as ITD moisture associated skin damage.  I will add bilateral Prevalon Boots for correction of the lateral foot rotation and a bariatric bed with low air loss feature to accommodate her body habitus (allowing for more movement in bed) as well as the drying feature to the bilateral buttocks of the low air loss. Debridement of the right heel is not indicated with known venous insufficiency; we will continue to support the drying of the ruptured blister in the event it will either reattach or heal beneath the epidermis, shedding it without consequence as a dry "shell".  Call placed to pager # [phone] at 11:30am to discuss as requested.  No call back as of this time.  Patient requesting different mobility aides at home; suggest PT assessment for larger wheelchair, coordination with Case Management and HHRN for services.  WOC nursing team will not follow, but will remain available to this patient, the nursing and medical teams.  Please re-consult if needed. Thanks, Ladona MowLaurie Nayelly Laughman, MSN, RN, GNP, Hans EdenCWOCN, CWON-AP, FAAN  Pager# [phone]) [phone]

## 2017-07-13 NOTE — Progress Notes (Signed)
Spoke with patient at bedside. She lives at home with her mother who assists with wound care. She is somewhat immobile and spends most of her time in bed. Told her that [facility]. She has a PCP she follows with in [place]. She will speak with her mom to come and learn about wound care. Spoke with bedside nurse, he will reinforce wound care and do teaching in anticipation of d/c. Contacted attending to make him aware. [phone]

## 2017-07-13 NOTE — Evaluation (Signed)
Physical Therapy Evaluation Patient Details Name: Ellen NicksSavoeuth Delorey MRN: [id number] DOB: 09/14/1980 Today's Date: 07/13/2017   History of Present Illness  Patient is a 36 year old female with past medical history of morbid obesity, chronic lymphedema of lower extremity patient is the emergency department with worsening bilateral lower extremity pain and edema.  She also has severe  inflammation of her abdominal wall suggestive of panniculitis.  Patient admitted for IV antibiotics and wound care  Clinical Impression  The patient did mobilize to sitting at the bed edge. Will need a wide Rw to attempt to stand and pivot. Complains of pain on right heel. Pt admitted with above diagnosis. Pt currently with functional limitations due to the deficits listed below (see PT Problem List).  Pt will benefit from skilled PT to increase their independence and safety with mobility to allow discharge to the venue listed below.       Follow Up Recommendations Home health PT;SNF(needs to be able to pivot)    Equipment Recommendations  (scooter)    Recommendations for Other Services       Precautions / Restrictions Precautions Precautions: Fall Precaution Comments: obesity with sore on right heel Restrictions Weight Bearing Restrictions: No      Mobility  Bed Mobility Overal bed mobility: Needs Assistance Bed Mobility: Supine to Sit;Sit to Supine     Supine to sit: Mod assist;+2 for physical assistance;+2 for safety/equipment Sit to supine: +2 for physical assistance;Mod assist;+2 for safety/equipment   General bed mobility comments: assist with leg and trunk, 2 assist to slide  to new bed using slide sheets and pad.  Transfers                 General transfer comment: did not attempt due to patient reports right heel pain  Ambulation/Gait                Stairs            Wheelchair Mobility    Modified Rankin (Stroke Patients Only)       Balance Overall balance  assessment: Needs assistance Sitting-balance support: No upper extremity supported;Feet supported Sitting balance-Leahy Scale: Fair                                       Pertinent Vitals/Pain Pain Assessment: Faces Faces Pain Scale: Hurts even more Pain Location: right leg and foot Pain Descriptors / Indicators: Discomfort Pain Intervention(s): Monitored during session;Limited activity within patient's tolerance    Home Living Family/patient expects to be discharged to:: Private residence Living Arrangements: Parent;Children Available Help at Discharge: Family Type of Home: Apartment Home Access: Level entry     Home Layout: One level Home Equipment: Wheelchair - manual      Prior Function Level of Independence: Needs assistance   Gait / Transfers Assistance Needed: walks  short distance pushing whell chair.  ADL's / Homemaking Assistance Needed: needs assistance        Hand Dominance        Extremity/Trunk Assessment   Upper Extremity Assessment Upper Extremity Assessment: Overall WFL for tasks assessed    Lower Extremity Assessment Lower Extremity Assessment: RLE deficits/detail;LLE deficits/detail RLE Deficits / Details: lifts leg from bed  LLE Deficits / Details: same as right    Cervical / Trunk Assessment Cervical / Trunk Assessment: Normal  Communication   Communication: No difficulties  Cognition Arousal/Alertness: Awake/alert Behavior During Therapy: WFL for  tasks assessed/performed Overall Cognitive Status: Within Functional Limits for tasks assessed                                        General Comments      Exercises     Assessment/Plan    PT Assessment Patient needs continued PT services  PT Problem List Decreased strength;Decreased range of motion;Obesity;Decreased activity tolerance;Decreased skin integrity;Decreased safety awareness;Decreased mobility       PT Treatment Interventions DME  instruction;Functional mobility training;Therapeutic activities;Patient/family education;Therapeutic exercise;Wheelchair mobility training    PT Goals (Current goals can be found in the Care Plan section)  Acute Rehab PT Goals Patient Stated Goal: to be able to go home PT Goal Formulation: With patient Time For Goal Achievement: 07/27/17 Potential to Achieve Goals: Fair    Frequency Min 2X/week   Barriers to discharge        Co-evaluation               AM-PAC PT "6 Clicks" Daily Activity  Outcome Measure Difficulty turning over in bed (including adjusting bedclothes, sheets and blankets)?: Unable Difficulty moving from lying on back to sitting on the side of the bed? : Unable Difficulty sitting down on and standing up from a chair with arms (e.g., wheelchair, bedside commode, etc,.)?: Unable Help needed moving to and from a bed to chair (including a wheelchair)?: Total Help needed walking in hospital room?: Total Help needed climbing 3-5 steps with a railing? : Total 6 Click Score: 6    End of Session   Activity Tolerance: Patient tolerated treatment well Patient left: in bed;with call bell/phone within reach;with nursing/sitter in room Nurse Communication: Mobility status PT Visit Diagnosis: Unsteadiness on feet (R26.81);Pain Pain - Right/Left: Right Pain - part of body: Ankle and joints of foot    Time: 1537-1610 PT Time Calculation (min) (ACUTE ONLY): 33 min   Charges:   PT Evaluation $PT Eval Moderate Complexity: 1 Mod PT Treatments $Therapeutic Activity: 8-22 mins   PT G CodesBlanchard Kelch:      [place] Latulippe PT [phone]   Rada HayHill, Marithza Malachi Elizabeth 07/13/2017, 5:15 PM

## 2017-07-14 DIAGNOSIS — L03115 Cellulitis of right lower limb: Secondary | ICD-10-CM

## 2017-07-14 LAB — CBC WITH DIFFERENTIAL/PLATELET
Basophils Absolute: 0.1 10*3/uL (ref 0.0–0.1)
Basophils Relative: 0 %
EOS ABS: 0.4 10*3/uL (ref 0.0–0.7)
EOS PCT: 3 %
HCT: 28.2 % — ABNORMAL LOW (ref 36.0–46.0)
Hemoglobin: 8.4 g/dL — ABNORMAL LOW (ref 12.0–15.0)
LYMPHS ABS: 2.5 10*3/uL (ref 0.7–4.0)
Lymphocytes Relative: 20 %
MCH: 21.6 pg — AB (ref 26.0–34.0)
MCHC: 29.8 g/dL — AB (ref 30.0–36.0)
MCV: 72.5 fL — ABNORMAL LOW (ref 78.0–100.0)
MONO ABS: 0.8 10*3/uL (ref 0.1–1.0)
MONOS PCT: 6 %
Neutro Abs: 8.9 10*3/uL — ABNORMAL HIGH (ref 1.7–7.7)
Neutrophils Relative %: 71 %
PLATELETS: 476 10*3/uL — AB (ref 150–400)
RBC: 3.89 MIL/uL (ref 3.87–5.11)
RDW: 19.3 % — AB (ref 11.5–15.5)
WBC: 12.7 10*3/uL — AB (ref 4.0–10.5)

## 2017-07-14 LAB — CULTURE, BLOOD (ROUTINE X 2)
CULTURE: NO GROWTH
Culture: NO GROWTH
Special Requests: ADEQUATE
Special Requests: ADEQUATE

## 2017-07-14 LAB — CREATININE, SERUM
CREATININE: 0.64 mg/dL (ref 0.44–1.00)
GFR calc Af Amer: >60 mL/min (ref >60)
GFR calc non Af Amer: >60 mL/min (ref >60)

## 2017-07-14 MED ORDER — ADULT MULTIVITAMIN W/MINERALS CH: 1.0000 | ORAL_TABLET | Freq: Every day | ORAL | 0 refills | Status: AC

## 2017-07-14 MED ORDER — NYSTATIN 100000 UNIT/GM EX POWD: Freq: Three times a day (TID) | CUTANEOUS | 1 refills | Status: AC

## 2017-07-14 MED ORDER — CLINDAMYCIN HCL 300 MG PO CAPS: 300.0000 mg | ORAL_CAPSULE | Freq: Three times a day (TID) | ORAL | 0 refills | Status: AC

## 2017-07-14 MED ORDER — GERHARDT'S BUTT CREAM: 1.0000 "application " | TOPICAL_CREAM | Freq: Every day | CUTANEOUS | 0 refills | Status: AC

## 2017-07-14 MED ORDER — FERROUS SULFATE 325 (65 FE) MG PO TABS: 325.0000 mg | ORAL_TABLET | Freq: Two times a day (BID) | ORAL | 0 refills | Status: AC

## 2017-07-14 MED ORDER — PRO-STAT SUGAR FREE PO LIQD: 30.0000 mL | Freq: Two times a day (BID) | ORAL | 0 refills | Status: AC

## 2017-07-14 MED ORDER — ACETAMINOPHEN 325 MG PO TABS: 650.0000 mg | ORAL_TABLET | Freq: Four times a day (QID) | ORAL | 0 refills | Status: AC | PRN

## 2017-07-14 MED ORDER — DIPHENHYDRAMINE HCL 25 MG PO CAPS: 25.0000 mg | ORAL_CAPSULE | Freq: Four times a day (QID) | ORAL | 0 refills | Status: AC | PRN

## 2017-07-14 NOTE — Discharge Instructions (Signed)

## 2017-07-14 NOTE — Discharge Summary (Addendum)
Physician Discharge Summary  Ellen NicksSavoeuth Schroeder ZOX:[id number] DOB: 06/18/1981 DOA: 07/11/2017  PCP: Anne HahnBoyd, Timothy A, PA-C  Admit date: 07/11/2017 Discharge date: 07/14/2017  Recommendations for Outpatient Follow-up:  1. Outpt follow up with PCP 2. Continue Clinda for 10 days on discharge   Discharge Diagnoses:  Principal Problem:   Cellulitis of right leg Active Problems:   Panniculitis   Anemia   Lymphedema of lower extremity   Asthma, chronic, unspecified asthma severity, with acute exacerbation    Discharge Condition: stable   Diet recommendation: as tolerated   History of present illness:  36 year old female with past medical history of morbid obesity, chronic lymphedema of lower extremity patient is the emergency department with worsening bilateral lower extremity pain and edema.  She also has severe  inflammation of her abdominal wall suggestive of panniculitis.  Patient admitted for IV antibiotics and wound care.   Hospital Course:   Sepsis secondary to Cellulitis of lower extremities/panniculitis: Sepsis started on admission. Started on Vanco and Zosyn.    Had mild leukocytosis.  She has remained afebrile. Change to clinda on discharge, continue for 10 days on discharge HH orders in place   Anemia:Iron studies shows iron deficiency anemia.  Started on iron supplementation.  Chronic lymphedema/debility: HH orders in place   Asthma: Stable    DVT prophylaxis: Lovenox subQ Code Status: Full Family Communication: No family at the bedside    Signed:  Manson PasseyAlma Chidubem Chaires, MD  [facility] 07/14/2017, 2:10 PM  Pager #: [phone]  Time spent in minutes: more than 30 minutes    Discharge Exam: Vitals:   07/13/17 2023 07/14/17 0424  BP: 112/67 118/69  Pulse: 89 91  Resp: 14 16  Temp: 98.7 F (37.1 C) 98.5 F (36.9 C)  SpO2: 92% 93%   Vitals:   07/12/17 2047 07/13/17 0407 07/13/17 2023 07/14/17 0424  BP: 118/60 106/62 112/67 118/69  Pulse: 91  95 89 91  Resp: 18 16 14 16   Temp: 98.6 F (37 C) 98.2 F (36.8 C) 98.7 F (37.1 C) 98.5 F (36.9 C)  TempSrc: Oral Oral Oral Oral  SpO2: 92% 91% 92% 93%  Weight:      Height:        General: Pt is alert, follows commands appropriately, not in acute distress Cardiovascular: Regular rate and rhythm, S1/S2 +, no murmurs Respiratory: Clear to auscultation bilaterally, no wheezing, no crackles, no rhonchi Abdominal: Soft, non tender, non distended, bowel sounds +, no guarding Extremities: LE edema, dressing in place bilateral LE Neuro: Grossly nonfocal  Discharge Instructions  Discharge Instructions    Call MD for:  persistant nausea and vomiting   Complete by:  As directed    Call MD for:  redness, tenderness, or signs of infection (pain, swelling, redness, odor or green/yellow discharge around incision site)   Complete by:  As directed    Call MD for:  severe uncontrolled pain   Complete by:  As directed    Diet - low sodium heart healthy   Complete by:  As directed    Discharge instructions   Complete by:  As directed    Clindamycin for 10 days on discharge   Increase activity slowly   Complete by:  As directed      Allergies as of 07/14/2017   No Known Allergies     Medication List    TAKE these medications   acetaminophen 325 MG tablet Commonly known as:  TYLENOL Take 2 tablets (650 mg total) by mouth every  6 (six) hours as needed for mild pain (or Fever >/= 101).   clindamycin 300 MG capsule Commonly known as:  CLEOCIN Take 1 capsule (300 mg total) by mouth 3 (three) times daily for 10 days.   diphenhydrAMINE 25 mg capsule Commonly known as:  BENADRYL Take 1 capsule (25 mg total) by mouth every 6 (six) hours as needed for itching.   feeding supplement (PRO-STAT SUGAR FREE 64) Liqd Take 30 mLs by mouth 2 (two) times daily.   ferrous sulfate 325 (65 FE) MG tablet Take 1 tablet (325 mg total) by mouth 2 (two) times daily with a meal.   furosemide 20 MG  tablet Commonly known as:  LASIX Take 20 mg by mouth 2 (two) times daily.   Gerhardt's butt cream Crea Apply 1 application topically daily.   ibuprofen 800 MG tablet Commonly known as:  ADVIL,MOTRIN Take 800 mg by mouth 3 (three) times daily.   multivitamin with minerals Tabs tablet Take 1 tablet by mouth daily. Start taking on:  07/15/2017   nystatin powder Commonly known as:  MYCOSTATIN/NYSTOP Apply topically 3 (three) times daily.   potassium chloride 20 MEQ/15ML (10%) Soln Take 15 mLs by mouth daily.   traMADol 50 MG tablet Commonly known as:  ULTRAM Take 1-2 tablets by mouth every 8 (eight) hours as needed for moderate pain or severe pain.            Durable Medical Equipment  (From admission, onward)        Start     Ordered   07/13/17 1508  For home use only DME Wheelchair electric  Once     07/13/17 1510   07/13/17 1507  For home use only DME Hospital bed  Once    Question:  Bed type  Answer:  Semi-electric   07/13/17 1510     Follow-up Information    Anne Hahn, PA-C. Schedule an appointment as soon as possible for a visit in 1 week(s).   Specialty:  General Practice Contact information: [address] [place] [address] [phone]            The results of significant diagnostics from this hospitalization (including imaging, microbiology, ancillary and laboratory) are listed below for reference.    Significant Diagnostic Studies: Dg Tibia/fibula Right  Result Date: 07/09/2017 CLINICAL DATA:  Leg wounds EXAM: RIGHT TIBIA AND FIBULA - 2 VIEW COMPARISON:  None. FINDINGS: No fracture or malalignment. No periostitis or bone destruction. Diffuse soft tissue swelling. No soft tissue gas IMPRESSION: No acute osseous abnormality Electronically Signed   By: Jasmine Pang M.D.   On: 07/09/2017 20:57   Ct Abdomen Pelvis W Contrast  Result Date: 07/09/2017 CLINICAL DATA:  36 year old female with nausea vomiting. EXAM: CT ABDOMEN AND  PELVIS WITH CONTRAST TECHNIQUE: Multidetector CT imaging of the abdomen and pelvis was performed using the standard protocol following bolus administration of intravenous contrast. CONTRAST:  ISOVUE-300 IOPAMIDOL (ISOVUE-300) INJECTION 61% COMPARISON:  None. FINDINGS: Lower chest: Minimal bibasilar atelectatic changes. There is herniation of small amount of mesenteric fat through the diaphragmatic hiatus. No intra-abdominal free air or free fluid. Hepatobiliary: There is diffuse fatty infiltration of the liver. No intrahepatic biliary ductal dilatation. The gallbladder is mildly distended. Tiny stones or sludge may be present in the gallbladder fundus. No pericholecystic fluid or evidence of acute cholecystitis by CT. Pancreas: Unremarkable. No pancreatic ductal dilatation or surrounding inflammatory changes. Spleen: Normal in size without focal abnormality. Adrenals/Urinary Tract: The adrenal glands  are unremarkable. There is no hydronephrosis on either side. The visualized ureters and urinary bladder appear unremarkable. Stomach/Bowel: There is no bowel obstruction or active inflammation. Normal appendix. Vascular/Lymphatic: No significant vascular findings are present. No enlarged abdominal or pelvic lymph nodes. Reproductive: The uterus is anteverted and grossly unremarkable. The ovaries appear unremarkable as well. No pelvic mass. Other: There is thickening of the skin and stranding of the subcutaneous soft tissues of the anterior pelvic wall. No fluid collection. Musculoskeletal: No acute or significant osseous findings. IMPRESSION: 1. No bowel obstruction or active inflammation.  Normal appendix. 2. Fatty liver. Electronically Signed   By: Elgie Collard M.D.   On: 07/09/2017 22:08   Dg Foot Complete Right  Result Date: 07/09/2017 CLINICAL DATA:  Right leg wounds EXAM: RIGHT FOOT COMPLETE - 3+ VIEW COMPARISON:  12/31/2005 FINDINGS: No fracture or malalignment. No soft tissue gas. No periostitis  or bone destruction. IMPRESSION: No acute osseous abnormality. Electronically Signed   By: Jasmine Pang M.D.   On: 07/09/2017 20:56   Dg Femur Min 2 Views Right  Result Date: 07/09/2017 CLINICAL DATA:  Leg pain EXAM: RIGHT FEMUR 2 VIEWS COMPARISON:  None. FINDINGS: The study is limited by habitus. No fracture or malalignment. Mild degenerative changes at the knee. Soft tissue swelling. No soft tissue gas. No periostitis or bone destruction IMPRESSION: No definite acute osseous abnormality. Electronically Signed   By: Jasmine Pang M.D.   On: 07/09/2017 20:58    Microbiology: Recent Results (from the past 240 hour(s))  Blood culture (routine x 2)     Status: None   Collection Time: 07/09/17  6:51 PM  Result Value Ref Range Status   Specimen Description BLOOD RIGHT ANTECUBITAL  Final   Special Requests   Final    BOTTLES DRAWN AEROBIC AND ANAEROBIC Blood Culture adequate volume   Culture   Final    NO GROWTH 5 DAYS Performed at [facility], [address], [place], [place] [address]    Report Status 07/14/2017 FINAL  Final  Blood culture (routine x 2)     Status: None   Collection Time: 07/09/17  6:56 PM  Result Value Ref Range Status   Specimen Description BLOOD LEFT HAND  Final   Special Requests   Final    BOTTLES DRAWN AEROBIC AND ANAEROBIC Blood Culture adequate volume   Culture   Final    NO GROWTH 5 DAYS Performed at [facility], [address] [place], [place] [address]    Report Status 07/14/2017 FINAL  Final     Labs: Basic Metabolic Panel: Recent Labs  Lab 07/09/17 1851 07/10/17 0050 07/10/17 0500 07/11/17 0222 07/11/17 0501 07/12/17 0336 07/13/17 0332 07/14/17 0349  NA 129* 137 137 135  --  139  --   --   K 2.8* 2.8* 3.5 2.9* 3.1* 3.6  --   --   CL 94* 101 104 102  --  105  --   --   CO2 28 28 27 25   --  28  --   --   GLUCOSE 149* 113* 117* 124*  --  131*  --   --   BUN 9 6 <5* <5*  --  <5*  --   --   CREATININE 0.75 0.65 0.62 0.63  --   0.61 0.72 0.64  CALCIUM 8.2* 7.9* 7.7* 7.9*  --  8.0*  --   --   MG  --   --   --   --  1.8  --   --   --    Liver Function Tests: Recent Labs  Lab 07/09/17 1851  AST 26  ALT 15  ALKPHOS 51  BILITOT 0.7  PROT 7.6  ALBUMIN 2.6*   No results for input(s): LIPASE, AMYLASE in the last 168 hours. No results for input(s): AMMONIA in the last 168 hours. CBC: Recent Labs  Lab 07/09/17 1851 07/10/17 0500 07/11/17 0400 07/12/17 0336 07/14/17 0349  WBC 17.5* 12.5* 13.5* 11.3* 12.7*  NEUTROABS 14.3*  --  10.1* 7.4 8.9*  HGB 9.1* 8.5* 7.8* 7.9* 8.4*  HCT 29.6* 27.6* 25.8* 26.8* 28.2*  MCV 69.8* 69.9* 70.7* 72.0* 72.5*  PLT 394 326 361 424* 476*   Cardiac Enzymes: No results for input(s): CKTOTAL, CKMB, CKMBINDEX, TROPONINI in the last 168 hours. BNP: BNP (last 3 results) Recent Labs    07/09/17 1853  BNP 26.4    ProBNP (last 3 results) No results for input(s): PROBNP in the last 8760 hours.  CBG: No results for input(s): GLUCAP in the last 168 hours.

## 2017-07-14 NOTE — Progress Notes (Addendum)
Pt is ready to be D/C today. Per RN, pt needs assistance with transportation. She needs an ambulance. [facility]. [facility] and discussed [facility], they are unable to f/u because [facility].

## 2017-07-14 NOTE — Progress Notes (Signed)
Pharmacy Antibiotic Note  Ellen Schroeder is a 36 y.o. female with worsening leg pain and swelling admitted on 07/11/2017 with wound infection.  Pharmacy has been consulted for zosyn and vancomycin dosing. 07/14/2017:   Day#4 V/Z  Afebrile  Mild leukocytosis  Renal function stable  Plan: Continue Zosyn 3.375 Gm IV q8h EI Continue Vancomycin 1500 mg IV q12h Goal AUC=400-500 Discussed with Dr Elisabeth Pigeonevine- plan discharge today on oral abx.  Defer Vancomycin levels unless plan changes.   Height: 4\' 10"  (147.3 cm) Weight: (!) 335 lb 1.6 oz (152 kg) IBW/kg (Calculated) : 40.9  Temp (24hrs), Avg:98.6 F (37 C), Min:98.5 F (36.9 C), Max:98.7 F (37.1 C)  Recent Labs  Lab 07/09/17 1851 07/09/17 2357  07/10/17 0214 07/10/17 0500 07/11/17 0222 07/11/17 0229 07/11/17 0400 07/11/17 0411 07/12/17 0336 07/13/17 0332 07/14/17 0349  WBC 17.5*  --   --   --  12.5*  --   --  13.5*  --  11.3*  --  12.7*  CREATININE 0.75  --    < >  --  0.62 0.63  --   --   --  0.61 0.72 0.64  LATICACIDVEN  --  2.84*  --  1.30  --   --  1.22  --  0.88  --   --   --    < > = values in this interval not displayed.    Estimated Creatinine Clearance: 129.7 mL/min (by C-G formula based on SCr of 0.64 mg/dL).    No Known Allergies  Antimicrobials this admission: 12/24 vancomycin >>  12/24 zosyn >>   Dose adjustments this admission:  Microbiology results: 12/24 BCx: ngtd 12/24 HIV: non-reactive  Thank you for allowing pharmacy to be a part of this patient's care.  Elson ClanLilliston, Aurelie Dicenzo Michelle 07/14/2017 10:27 AM

## 2018-05-26 IMAGING — CT CT ABD-PELV W/ CM
2 of 3 series · 13 of 36 positions shown, 19 images · IV contrast (ISOVUE)
Comparison: None.

CLINICAL DATA: 36-year-old female with nausea vomiting.

EXAM:
CT ABDOMEN AND PELVIS WITH CONTRAST
TECHNIQUE: Multidetector CT imaging of the abdomen and pelvis was performed
using the standard protocol following bolus administration of
intravenous contrast.
CONTRAST:  100mL 562CNL-4II IOPAMIDOL (562CNL-4II) INJECTION 61%

[Series 2: axial st · axial · 0.83mm/px · z∈[+897,+1267]mm · 12 of 84 slices shown, 17 images]
[im 5/84  soft-tissue]
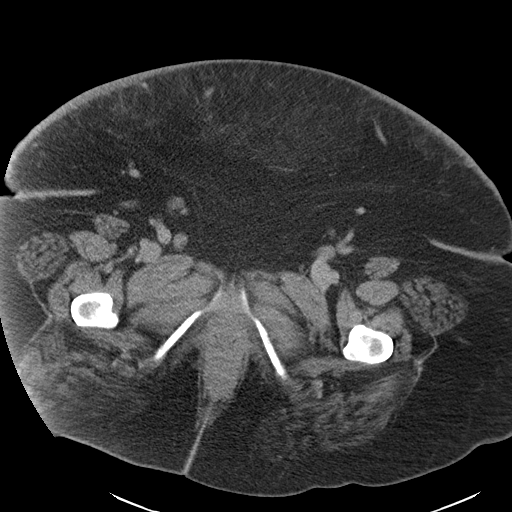
[im 5/84  bone]
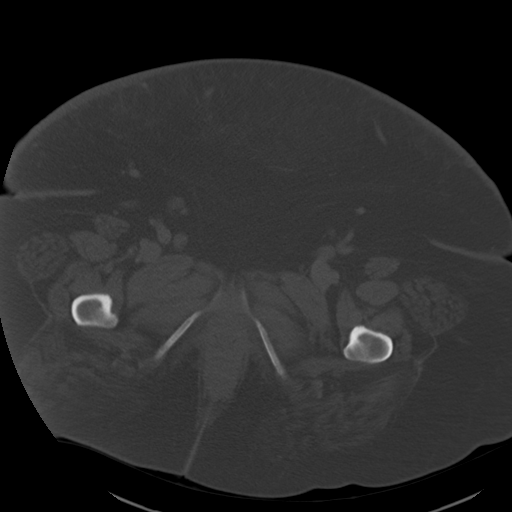
[im 14/84  soft-tissue]
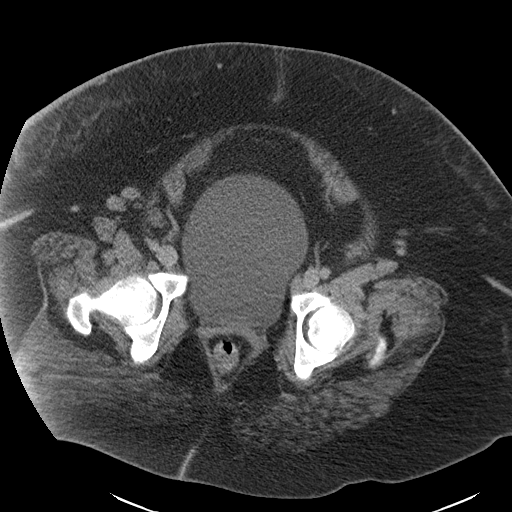
[im 19/84  soft-tissue]
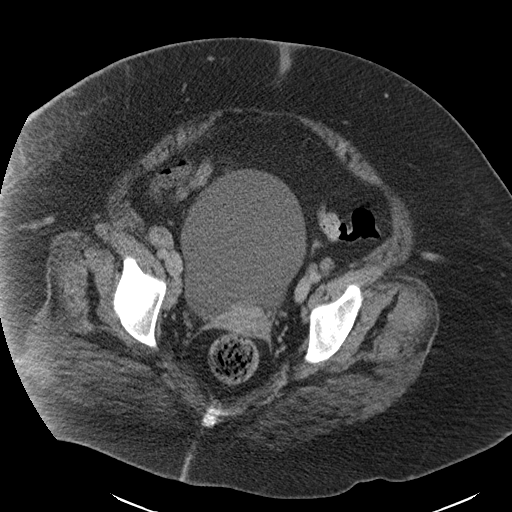
[im 28/84  soft-tissue]
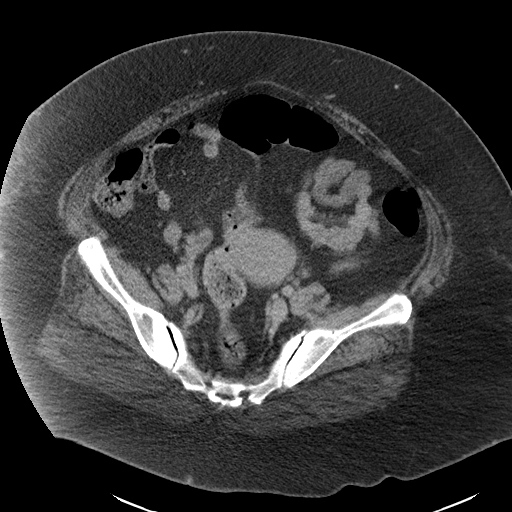
[im 33/84  soft-tissue]
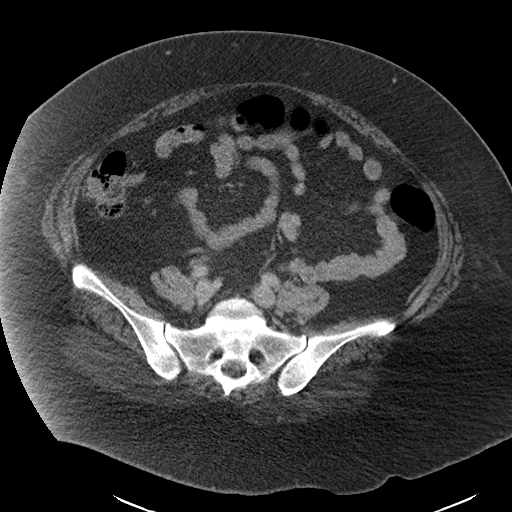
[im 42/84  soft-tissue]
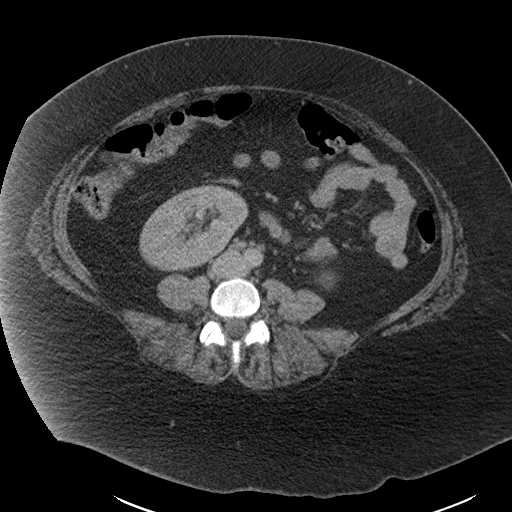
[im 51/84  soft-tissue]
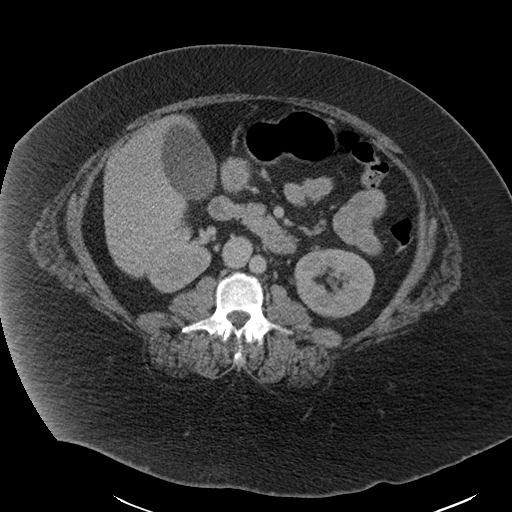
[im 56/84  soft-tissue]
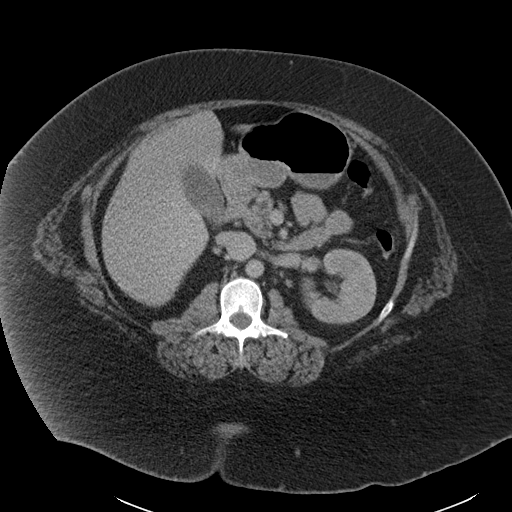
[im 65/84  soft-tissue]
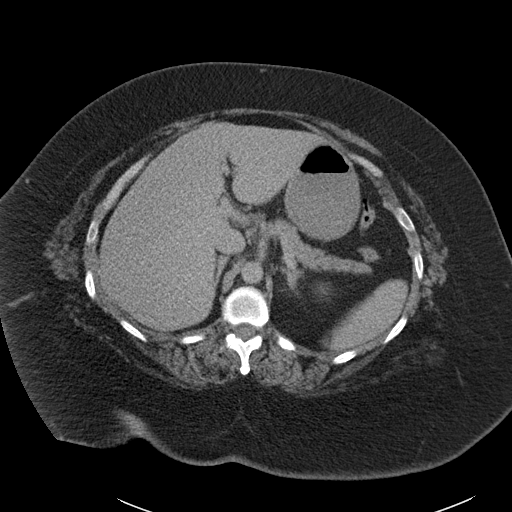
[im 65/84  lung]
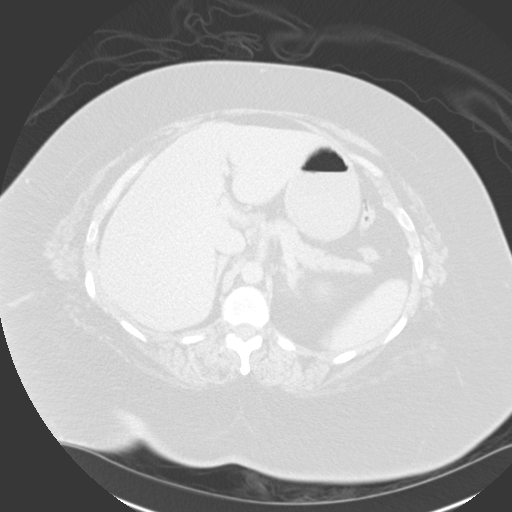
[im 65/84  bone]
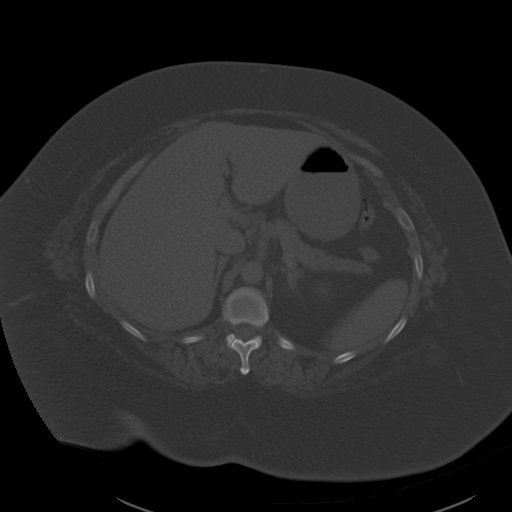
[im 70/84  soft-tissue]
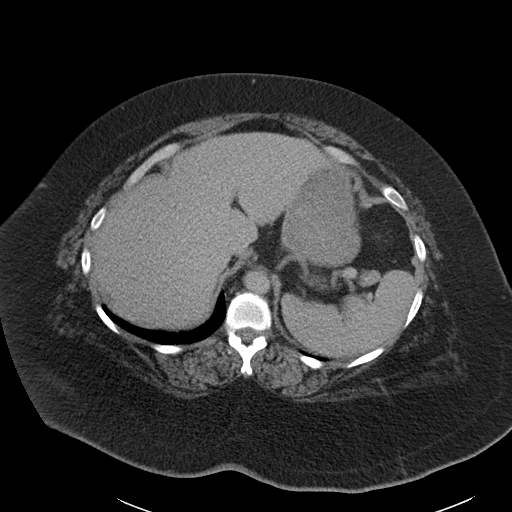
[im 70/84  lung]
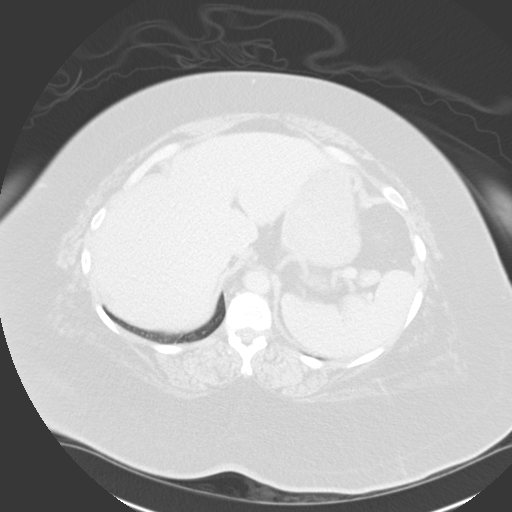
[im 74/84  lung]
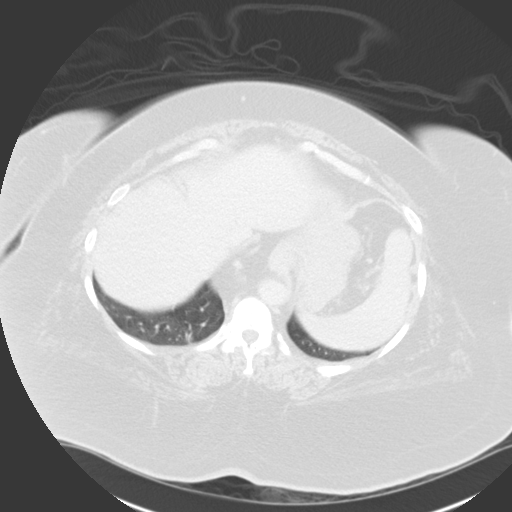
[im 79/84  soft-tissue]
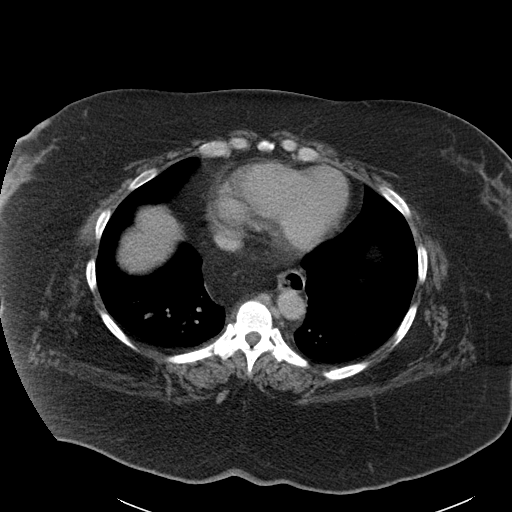
[im 79/84  lung]
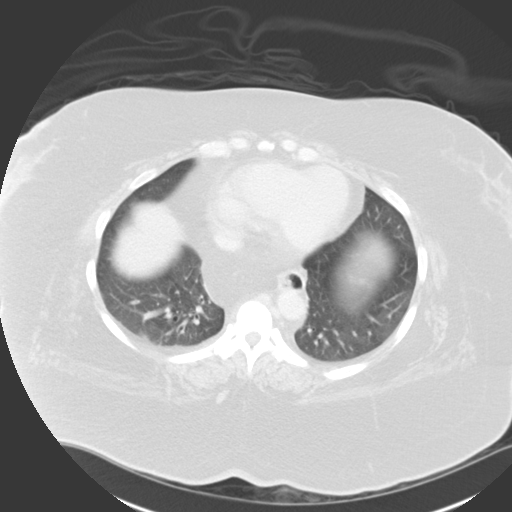

[Series 602: <mpr thick range> · sagittal · 0.83mm/px · 1 of 141 slices shown, 2 images]
[im 47/141  soft-tissue]
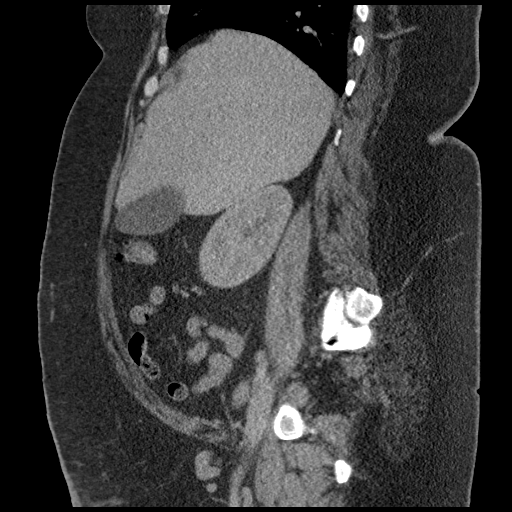
[im 47/141  bone]
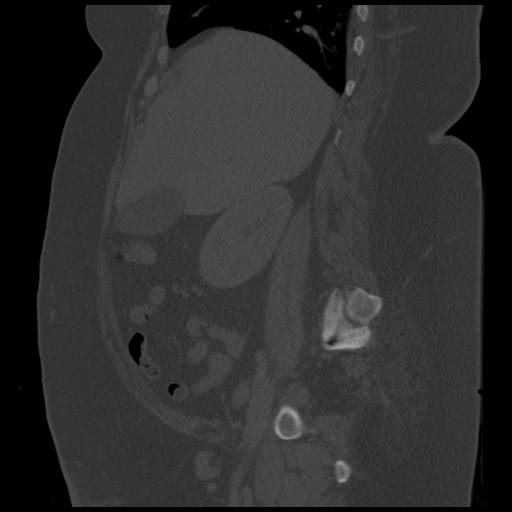

[13 of 36 positions shown; findings below may reference images not displayed]

FINDINGS: Lower chest: Minimal bibasilar atelectatic changes. There is
herniation of small amount of mesenteric fat through the
diaphragmatic hiatus.

No intra-abdominal free air or free fluid.

Hepatobiliary: There is diffuse fatty infiltration of the liver. No
intrahepatic biliary ductal dilatation. The gallbladder is mildly
distended. Tiny stones or sludge may be present in the gallbladder
fundus. No pericholecystic fluid or evidence of acute cholecystitis
by CT.

Pancreas: Unremarkable. No pancreatic ductal dilatation or
surrounding inflammatory changes.

Spleen: Normal in size without focal abnormality.

Adrenals/Urinary Tract: The adrenal glands are unremarkable. There
is no hydronephrosis on either side. The visualized ureters and
urinary bladder appear unremarkable.

Stomach/Bowel: There is no bowel obstruction or active inflammation.
Normal appendix.

Vascular/Lymphatic: No significant vascular findings are present. No
enlarged abdominal or pelvic lymph nodes.

Reproductive: The uterus is anteverted and grossly unremarkable. The
ovaries appear unremarkable as well. No pelvic mass.

Other: There is thickening of the skin and stranding of the
subcutaneous soft tissues of the anterior pelvic wall. No fluid
collection.

Musculoskeletal: No acute or significant osseous findings.
IMPRESSION: 1. No bowel obstruction or active inflammation.  Normal appendix.
2. Fatty liver.

## 2018-06-26 ENCOUNTER — Encounter (HOSPITAL_COMMUNITY): Payer: Self-pay | Admitting: Emergency Medicine

## 2018-06-26 ENCOUNTER — Emergency Department (HOSPITAL_COMMUNITY)
Admission: EM | Admit: 2018-06-26 | Discharge: 2018-06-26 | Disposition: A | Discharge status: Home / Self Care | Payer: Medicaid Other | Attending: Emergency Medicine | Admitting: Emergency Medicine

## 2018-06-26 ENCOUNTER — Other Ambulatory Visit: Payer: Self-pay

## 2018-06-26 DIAGNOSIS — M79671 Pain in right foot: Secondary | ICD-10-CM | POA: Diagnosis present

## 2018-06-26 DIAGNOSIS — Z5321 Procedure and treatment not carried out due to patient leaving prior to being seen by health care provider: Secondary | ICD-10-CM | POA: Diagnosis not present

## 2018-06-26 NOTE — ED Notes (Signed)
Per registration, patient reports they are leaving. 

## 2018-06-26 NOTE — ED Triage Notes (Signed)
Patient c/o wound to right foot since 2017. Reports "it got better but started getting worse last week." Reports pain worsens with walking.

## 2018-06-27 ENCOUNTER — Emergency Department (HOSPITAL_COMMUNITY): Payer: Medicaid Other

## 2018-06-27 ENCOUNTER — Encounter (HOSPITAL_COMMUNITY): Payer: Self-pay

## 2018-06-27 ENCOUNTER — Emergency Department (HOSPITAL_COMMUNITY)
Admission: EM | Admit: 2018-06-27 | Discharge: 2018-06-27 | Disposition: A | Discharge status: Home / Self Care | Payer: Medicaid Other | Attending: Emergency Medicine | Admitting: Emergency Medicine

## 2018-06-27 DIAGNOSIS — J45909 Unspecified asthma, uncomplicated: Secondary | ICD-10-CM | POA: Insufficient documentation

## 2018-06-27 DIAGNOSIS — M7989 Other specified soft tissue disorders: Secondary | ICD-10-CM | POA: Insufficient documentation

## 2018-06-27 DIAGNOSIS — M79671 Pain in right foot: Secondary | ICD-10-CM | POA: Diagnosis present

## 2018-06-27 DIAGNOSIS — M7731 Calcaneal spur, right foot: Secondary | ICD-10-CM | POA: Insufficient documentation

## 2018-06-27 NOTE — Care Management (Signed)
[facility] received call from Adaline Sill. Smith NP [facility] needing [facility] equipment. Patient is having some difficulty ambulating and bearing weight. Patient discussed with EDP that she would benefit from a shower chair. [facility], [facility] CM explained that patient would need to contact [facility] to make arrangement to pick up the shower chair [address].

## 2018-06-27 NOTE — Progress Notes (Addendum)
CSW received a call stating pt needed durable medical equipment, assistance with medication and assistance with transportation.  CSW confirmed that RN CM assists with DME/Medication when appropriate but CSW would look into transportation needs and update the EPD at ph: [phone].  CSW will continue to follow for D/C needs.  Dorothe PeaJonathan F. Mackenzie Lia, LCSW, LCAS, CSI Clinical Social Worker Ph: [phone]

## 2018-06-27 NOTE — Progress Notes (Addendum)
Pt asked for transportation to [facility].  CSW stated pt would have to call family for out-of-town transport.  Pt stated her mother does not drive at night.  CSW encouraged pt to utilize other contacts and would return shortly.  CSW met with pt again and pt stated she could go to a [place] address.  Pt cannot ambulate and CSW provided a voucher to [place] address.  CSW will continue to follow for D/C needs.  Alphonse Guild. Faizah Kandler, LCSW, LCAS, CSI Clinical Social Worker Ph: [phone]

## 2018-06-27 NOTE — Discharge Instructions (Signed)
Pick up moleskin padding at the pharmacy and apply to heel.

## 2018-06-27 NOTE — ED Triage Notes (Signed)
Patient c/o right foot pain. Patient states she is having trouble walking. Patient is unable to raise her leg off of the stretcher foot rest.  Patient was in the ED yesterday and left AMA. Patient states her mother brought her and is unable to drive in the dark.

## 2018-06-27 NOTE — ED Provider Notes (Signed)
[facility]-EMERGENCY DEPT Provider Note   CSN: [id number] Arrival date & time: 06/27/18  1641     History   Chief Complaint Chief Complaint  Patient presents with  . Foot Pain    HPI Ellen Schroeder is a 37 y.o. female.  Patient complaining of right foot pain. Pain is somewhat chronic in nature, but has worsened over the last several days. The pain is located in the heel. No open wounds or redness noted. Callous noted.  The history is provided by the patient and medical records. No language interpreter was used.  Foot Pain  This is a recurrent problem. The current episode started more than 1 week ago. The problem occurs daily. The problem has been gradually worsening. The symptoms are aggravated by standing and walking.    Past Medical History:  Diagnosis Date  . Asthma   . Chronic back pain   . Lymph edema   . Morbid obesity [facility](HCC)     Patient Active Problem List   Diagnosis Date Noted  . Lymphedema of lower extremity 07/12/2017  . Asthma, chronic, unspecified asthma severity, with acute exacerbation 07/12/2017  . Anemia 07/11/2017  . Sepsis (HCC) 07/10/2017  . Cellulitis of right leg 07/10/2017  . 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 No pertinent surgical history.   OB History   No obstetric history on file.      Home Medications    Prior to Admission medications   Medication Sig Start Date End Date Taking? Authorizing Provider  acetaminophen (TYLENOL) 325 MG tablet Take 2 tablets (650 mg total) by mouth every 6 (six) hours as needed for mild pain (or Fever >/= 101). 07/14/17   Alison Murrayevine, Alma M, MD  Amino Acids-Protein Hydrolys (FEEDING SUPPLEMENT, PRO-STAT SUGAR FREE 64,) LIQD Take 30 mLs by mouth 2 (two) times daily. 07/14/17   Alison Murrayevine, Alma M, MD  diphenhydrAMINE (BENADRYL) 25 mg capsule Take 1 capsule (25 mg total) by mouth every 6 (six) hours as needed for itching. 07/14/17   Alison Murrayevine, Alma M, MD  ferrous sulfate 325 (65 FE) MG  tablet Take 1 tablet (325 mg total) by mouth 2 (two) times daily with a meal. 07/14/17   Alison Murrayevine, Alma M, MD  Hydrocortisone (GERHARDT'S BUTT CREAM) CREA Apply 1 application topically daily. 07/14/17   Alison Murrayevine, Alma M, MD  Multiple Vitamin (MULTIVITAMIN WITH MINERALS) TABS tablet Take 1 tablet by mouth daily. 07/15/17   Alison Murrayevine, Alma M, MD  nystatin (MYCOSTATIN/NYSTOP) powder Apply topically 3 (three) times daily. 07/14/17   Alison Murrayevine, Alma M, MD    Family History Family History  Problem Relation Age of Onset  . Diabetes Mother   . Hypertension Mother   . Diabetes Father   . Hypertension Father   . Hyperlipidemia Father     Social History Social History   Tobacco Use  . Smoking status: Never Smoker  . Smokeless tobacco: Never Used  Substance Use Topics  . Alcohol use: No    Frequency: Never  . Drug use: No     Allergies   Patient has no known allergies.   Review of Systems Review of Systems  Constitutional: Negative for unexpected weight change.  Cardiovascular: Positive for leg swelling.  All other systems reviewed and are negative.    Physical Exam Updated Vital Signs BP (!) 155/81 (BP Location: Right Arm)   Pulse 86   Temp 98.1 F (36.7 C) (Oral)   Resp 18   Ht 4\' 10"  (1.473 m)   Wt Marland Kitchen(!)  163.3 kg   LMP 06/16/2018 (Approximate)   SpO2 100%   BMI 75.24 kg/m   Physical Exam Vitals signs and nursing note reviewed.  Constitutional:      Appearance: She is obese. She is not toxic-appearing.  HENT:     Mouth/Throat:     Mouth: Mucous membranes are moist.     Pharynx: Oropharynx is clear.  Eyes:     Conjunctiva/sclera: Conjunctivae normal.  Cardiovascular:     Rate and Rhythm: Normal rate and regular rhythm.  Pulmonary:     Effort: Pulmonary effort is normal.     Breath sounds: Normal breath sounds.  Abdominal:     Palpations: Abdomen is soft.     Tenderness: There is no abdominal tenderness.  Musculoskeletal:        General: Swelling and tenderness  present.     Right lower leg: Edema present.     Left lower leg: Edema present.     Comments: Bilateral lymphedema of legs (chronic).  Skin:    General: Skin is warm and dry.     Findings: No lesion.  Neurological:     Mental Status: She is alert and oriented to person, place, and time.  Psychiatric:        Mood and Affect: Mood normal.        Behavior: Behavior normal.      ED Treatments / Results  Labs (all labs ordered are listed, but only abnormal results are displayed) Labs Reviewed - No data to display  EKG None  Radiology Dg Foot Complete Right  Result Date: 06/27/2018 CLINICAL DATA:  Foot pain EXAM: RIGHT FOOT COMPLETE - 3+ VIEW COMPARISON:  07/09/2017 FINDINGS: No acute displaced fracture or malalignment. Joint spaces are maintained. Soft tissues are unremarkable. Small plantar calcaneal spur. IMPRESSION: No acute osseous abnormality Electronically Signed   By: Jasmine Pang M.D.   On: 06/27/2018 19:06    Procedures Procedures (including critical care time)  Medications Ordered in ED Medications - No data to display   Initial Impression / Assessment and Plan / ED Course  I have reviewed the triage vital signs and the nursing notes.  Pertinent labs & imaging results that were available during my care of the patient were reviewed by me and considered in my medical decision making (see chart for details).     Patient X-Ray negative for obvious fracture or dislocation.  Patient with right heel spur.Conservative therapy recommended and discussed, with follow-up with PCP, orthopedics, or podiatry. Patient will be discharged home & is agreeable with above plan. Returns precautions discussed. Pt appears safe for discharge.  Final Clinical Impressions(s) / ED Diagnoses   Final diagnoses:  Foot pain, right  Heel spur, right    ED Discharge Orders    None       Felicie Morn, NP 06/27/18 2241    Lorre Nick, MD 06/28/18 2005

## 2018-12-16 DEATH — deceased

## 2019-05-14 IMAGING — DX DG FOOT COMPLETE 3+V*R*
3 series · 3 of 3 positions shown · non-contrast
Comparison: 07/09/2017

CLINICAL DATA: Foot pain

EXAM:
RIGHT FOOT COMPLETE - 3+ VIEW

[foot ap]
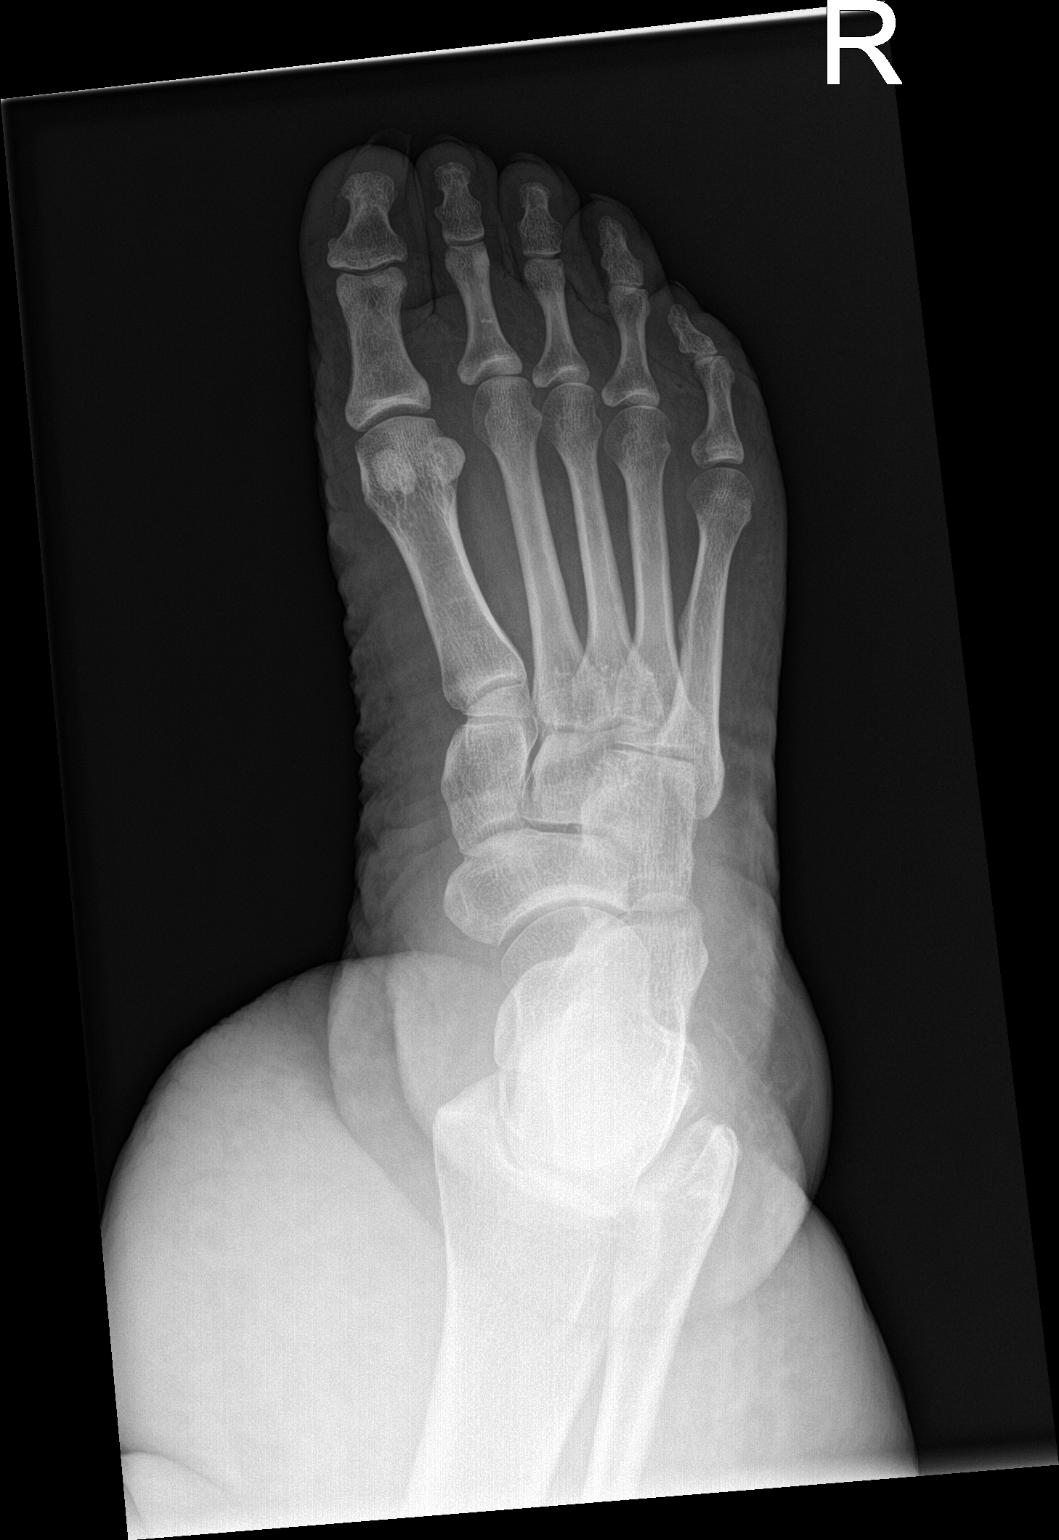

[foot obl]
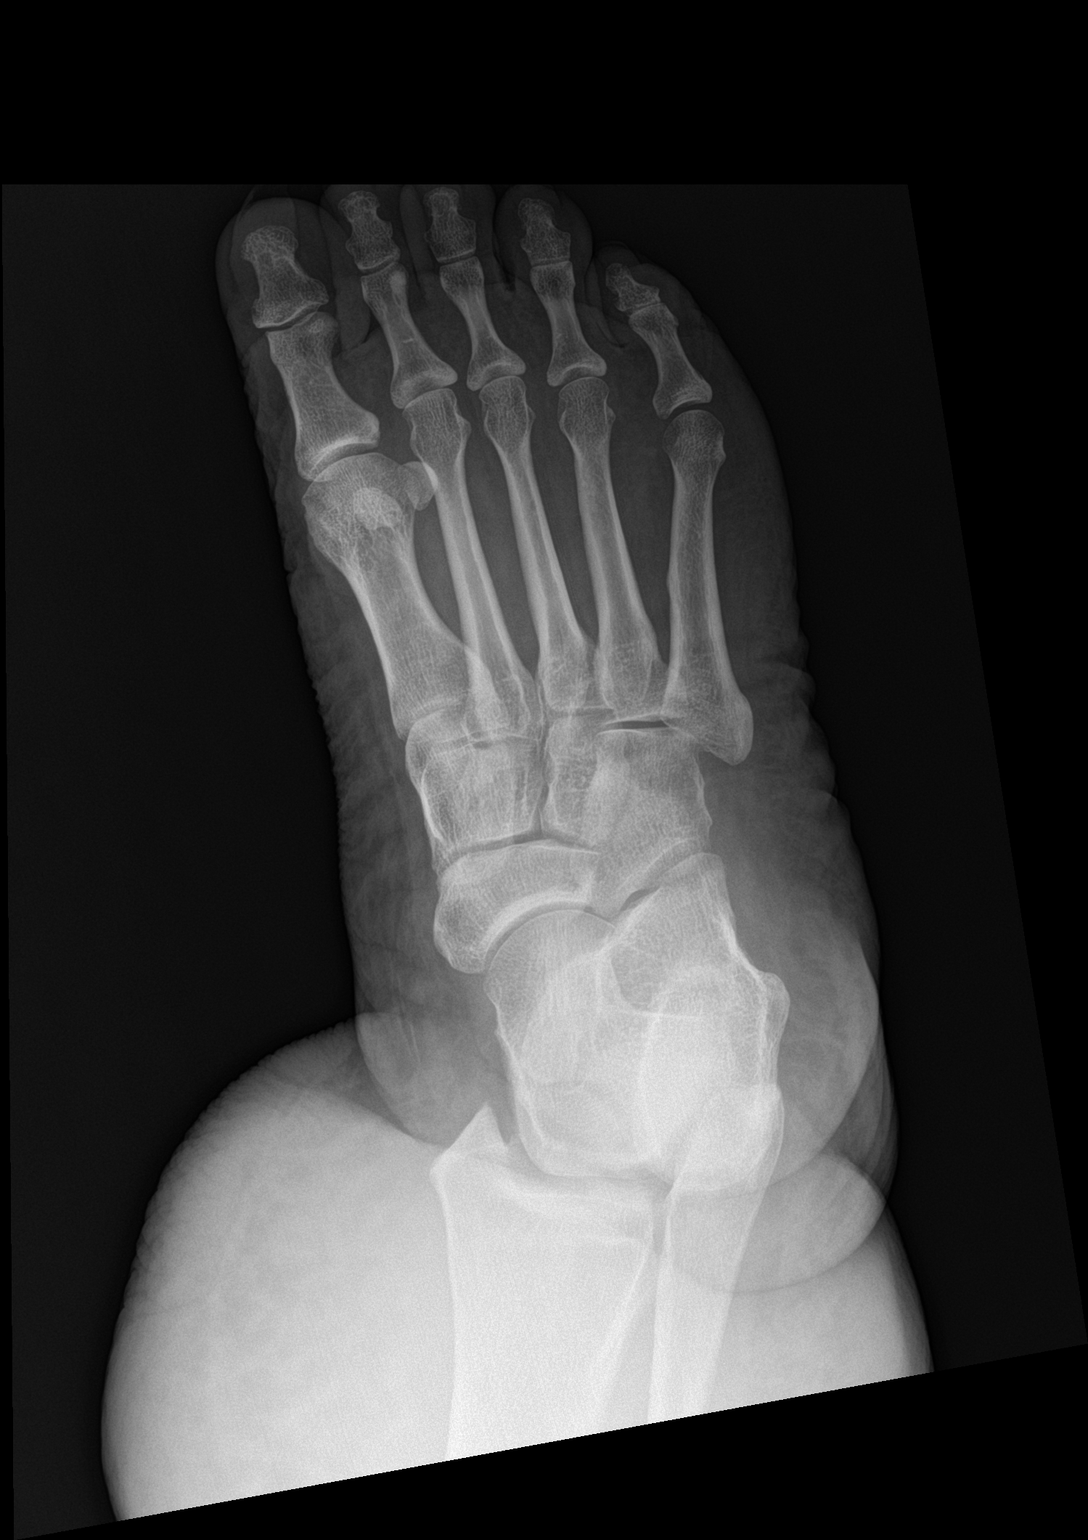

[foot lat]
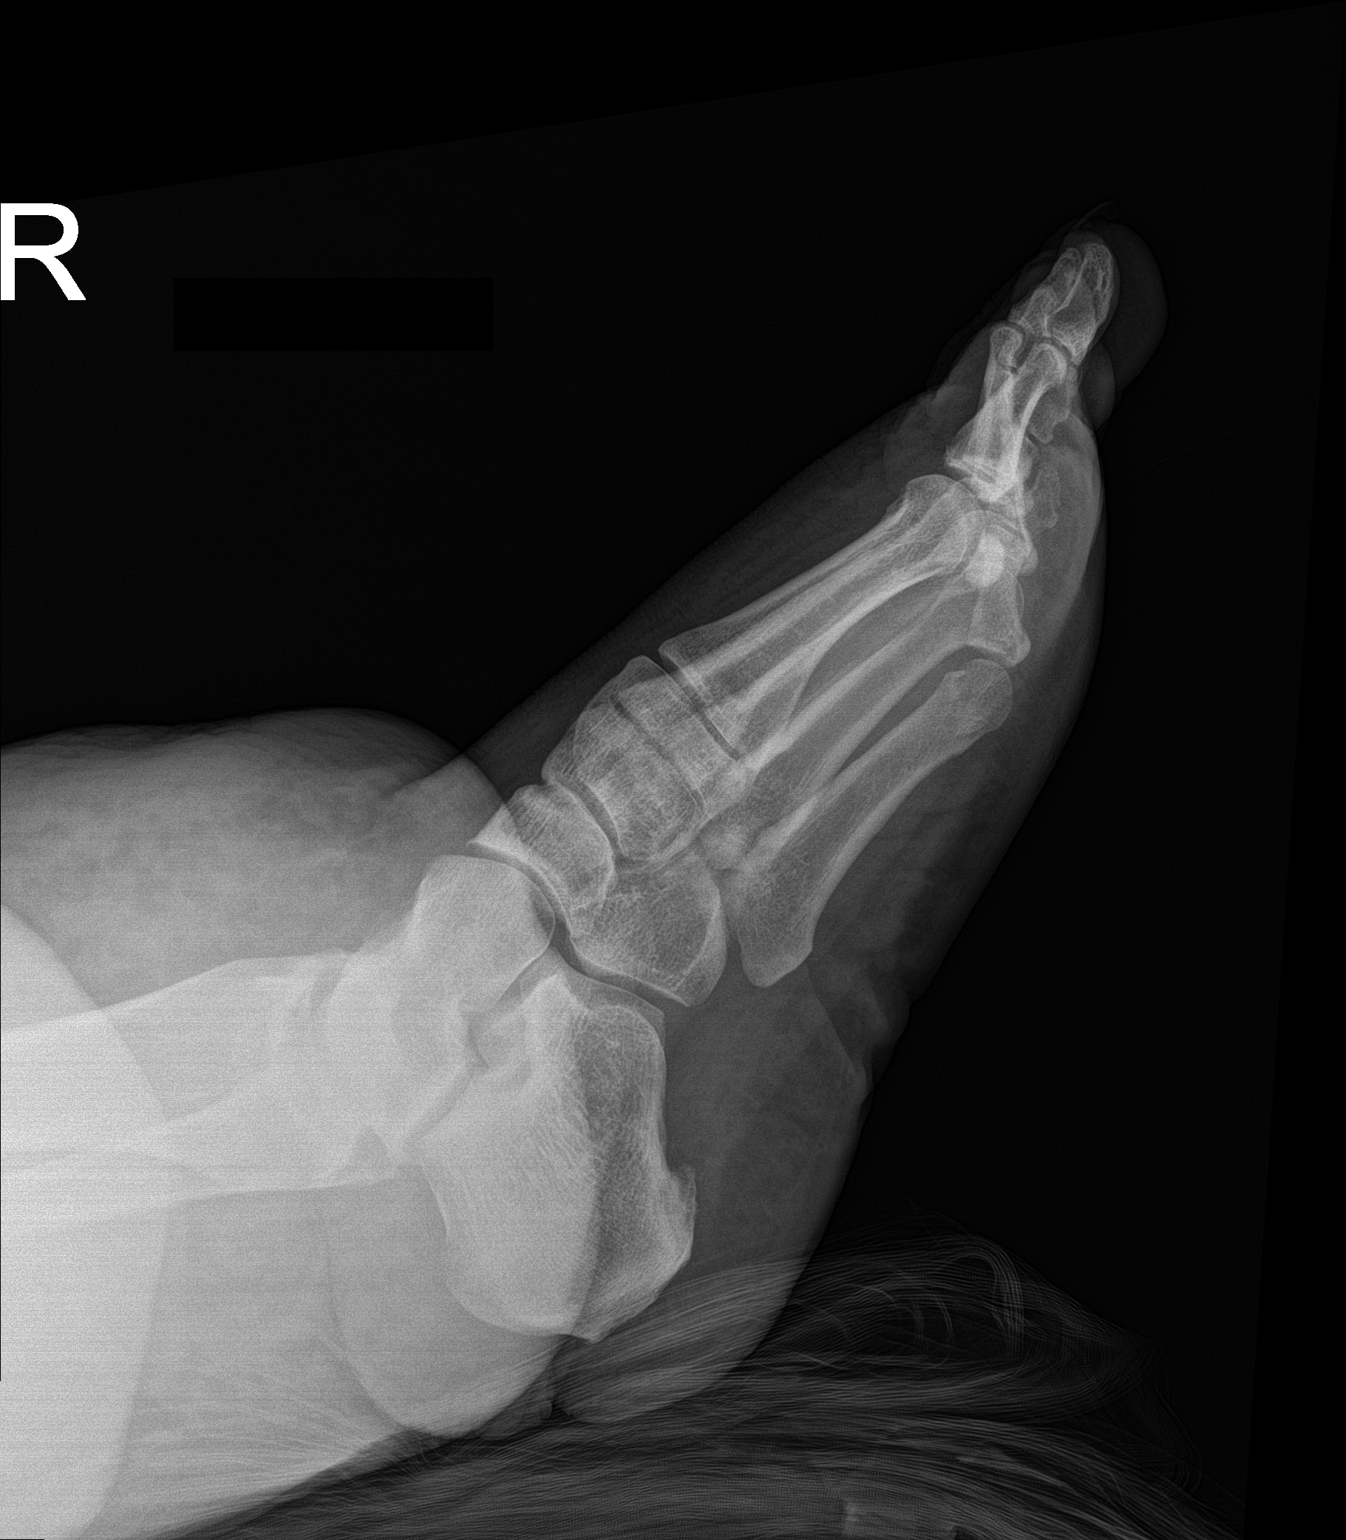

[3 of 3 positions shown; findings below may reference images not displayed]

FINDINGS: No acute displaced fracture or malalignment. Joint spaces are
maintained. Soft tissues are unremarkable. Small plantar calcaneal
spur.
IMPRESSION: No acute osseous abnormality
